# Patient Record
Sex: Female | Born: 1965 | Race: White | Hispanic: No | Marital: Married | Smoking: Never smoker
Health system: Southern US, Community
[De-identification: ages and names within clinical notes are randomized; demographics above are authoritative.]

## PROBLEM LIST (undated history)

## (undated) DIAGNOSIS — C801 Malignant (primary) neoplasm, unspecified: Secondary | ICD-10-CM

## (undated) DIAGNOSIS — T8859XA Other complications of anesthesia, initial encounter: Secondary | ICD-10-CM

## (undated) DIAGNOSIS — T4145XA Adverse effect of unspecified anesthetic, initial encounter: Secondary | ICD-10-CM

## (undated) DIAGNOSIS — R519 Headache, unspecified: Secondary | ICD-10-CM

## (undated) DIAGNOSIS — Z9889 Other specified postprocedural states: Secondary | ICD-10-CM

## (undated) DIAGNOSIS — T7840XA Allergy, unspecified, initial encounter: Secondary | ICD-10-CM

## (undated) DIAGNOSIS — K219 Gastro-esophageal reflux disease without esophagitis: Secondary | ICD-10-CM

## (undated) DIAGNOSIS — N6459 Other signs and symptoms in breast: Secondary | ICD-10-CM

## (undated) DIAGNOSIS — R112 Nausea with vomiting, unspecified: Secondary | ICD-10-CM

## (undated) DIAGNOSIS — R51 Headache: Secondary | ICD-10-CM

## (undated) HISTORY — PX: TONSILLECTOMY: SUR1361

## (undated) HISTORY — DX: Allergy, unspecified, initial encounter: T78.40XA

## (undated) HISTORY — DX: Malignant (primary) neoplasm, unspecified: C80.1

## (undated) HISTORY — PX: CHOLECYSTECTOMY: SHX55

---

## 2018-01-07 DIAGNOSIS — K449 Diaphragmatic hernia without obstruction or gangrene: Secondary | ICD-10-CM | POA: Diagnosis not present

## 2018-01-07 DIAGNOSIS — K219 Gastro-esophageal reflux disease without esophagitis: Secondary | ICD-10-CM | POA: Diagnosis not present

## 2018-01-07 DIAGNOSIS — G43909 Migraine, unspecified, not intractable, without status migrainosus: Secondary | ICD-10-CM | POA: Diagnosis not present

## 2018-01-14 DIAGNOSIS — Z01419 Encounter for gynecological examination (general) (routine) without abnormal findings: Secondary | ICD-10-CM | POA: Diagnosis not present

## 2018-01-14 DIAGNOSIS — Z13220 Encounter for screening for lipoid disorders: Secondary | ICD-10-CM | POA: Diagnosis not present

## 2018-01-25 DIAGNOSIS — Z1231 Encounter for screening mammogram for malignant neoplasm of breast: Secondary | ICD-10-CM | POA: Diagnosis not present

## 2018-01-30 DIAGNOSIS — Z1211 Encounter for screening for malignant neoplasm of colon: Secondary | ICD-10-CM | POA: Diagnosis not present

## 2018-02-08 DIAGNOSIS — R922 Inconclusive mammogram: Secondary | ICD-10-CM | POA: Diagnosis not present

## 2018-02-08 DIAGNOSIS — R928 Other abnormal and inconclusive findings on diagnostic imaging of breast: Secondary | ICD-10-CM | POA: Diagnosis not present

## 2018-02-08 DIAGNOSIS — R921 Mammographic calcification found on diagnostic imaging of breast: Secondary | ICD-10-CM | POA: Diagnosis not present

## 2018-02-11 DIAGNOSIS — N6012 Diffuse cystic mastopathy of left breast: Secondary | ICD-10-CM | POA: Diagnosis not present

## 2018-02-11 DIAGNOSIS — R921 Mammographic calcification found on diagnostic imaging of breast: Secondary | ICD-10-CM | POA: Diagnosis not present

## 2018-02-11 DIAGNOSIS — R928 Other abnormal and inconclusive findings on diagnostic imaging of breast: Secondary | ICD-10-CM | POA: Diagnosis not present

## 2018-02-26 ENCOUNTER — Other Ambulatory Visit: Payer: Self-pay | Admitting: Surgery

## 2018-02-26 DIAGNOSIS — N6320 Unspecified lump in the left breast, unspecified quadrant: Secondary | ICD-10-CM | POA: Diagnosis not present

## 2018-02-26 DIAGNOSIS — N6092 Unspecified benign mammary dysplasia of left breast: Secondary | ICD-10-CM

## 2018-02-28 ENCOUNTER — Other Ambulatory Visit: Payer: Self-pay | Admitting: Surgery

## 2018-02-28 DIAGNOSIS — N6092 Unspecified benign mammary dysplasia of left breast: Secondary | ICD-10-CM

## 2018-03-05 NOTE — Pre-Procedure Instructions (Signed)
Karen Chung  03/05/2018      [place], [place] [address] Phone: [phone] Fax: [phone]    Your procedure is scheduled on March 08, 2018.  Report to [facility] Admitting at 05:30 A.M.  Call this number if you have problems the morning of surgery:  [phone]   Remember:  Do not eat food or drink liquids after midnight.  **Please complete your PRE-SURGERY ENSURE that was provided before you leave your house the morning of surgery.  Please, if able, drink it in one setting. DO NOT SIP.**   Take these medicines the morning of surgery with A SIP OF WATER : Nothing  7 days prior to surgery STOP taking any Aspirin (unless otherwise instructed by your surgeon), Aleve, Naproxen, Ibuprofen, Motrin, Advil, Goody's, BC's, all herbal medications, fish oil, and all vitamins.   Do not wear jewelry, make-up or nail polish.  Do not wear lotions, powders, or perfumes, or deodorant.  Do not shave 48 hours prior to surgery.   Do not bring valuables to the hospital.   [facility] is not responsible for any belongings or valuables.  Contacts, dentures or bridgework may not be worn into surgery.  Leave your suitcase in the car.  After surgery it may be brought to your room.  For patients admitted to the hospital, discharge time will be determined by your treatment team.  Patients discharged the day of surgery will not be allowed to drive home.   Special instructions:   [facility]- Preparing For Surgery  Before surgery, you can play an important role. Because skin is not sterile, your skin needs to be as free of germs as possible. You can reduce the number of germs on your skin by washing with CHG (chlorahexidine gluconate) Soap before surgery.  CHG is an antiseptic cleaner which kills germs and bonds with the skin to continue killing germs even after washing.  Please do not use if you have  an allergy to CHG or antibacterial soaps. If your skin becomes reddened/irritated stop using the CHG.  Do not shave (including legs and underarms) for at least 48 hours prior to first CHG shower. It is OK to shave your face.  Please follow these instructions carefully.   1. Shower the NIGHT BEFORE SURGERY and the MORNING OF SURGERY with CHG.   2. If you chose to wash your hair, wash your hair first as usual with your normal shampoo.  3. After you shampoo, rinse your hair and body thoroughly to remove the shampoo.  4. Use CHG as you would any other liquid soap. You can apply CHG directly to the skin and wash gently with a scrungie or a clean washcloth.   5. Apply the CHG Soap to your body ONLY FROM THE NECK DOWN.  Do not use on open wounds or open sores. Avoid contact with your eyes, ears, mouth and genitals (private parts). Wash Face and genitals (private parts)  with your normal soap.  6. Wash thoroughly, paying special attention to the area where your surgery will be performed.  7. Thoroughly rinse your body with warm water from the neck down.  8. DO NOT shower/wash with your normal soap after using and rinsing off the CHG Soap.  9. Pat yourself dry with a CLEAN TOWEL.  10. Wear CLEAN PAJAMAS to bed the night before surgery, wear comfortable clothes the morning  of surgery  11. Place CLEAN SHEETS on your bed the night of your first shower and DO NOT SLEEP WITH PETS.    Day of Surgery: Do not apply any deodorants/lotions. Please wear clean clothes to the hospital/surgery center.      Please read over the following fact sheets that you were given.

## 2018-03-06 ENCOUNTER — Encounter (HOSPITAL_COMMUNITY): Payer: Self-pay

## 2018-03-06 ENCOUNTER — Encounter (HOSPITAL_COMMUNITY)
Admission: RE | Admit: 2018-03-06 | Discharge: 2018-03-06 | Disposition: A | Discharge status: Home / Self Care | Payer: BLUE CROSS/BLUE SHIELD | Source: Ambulatory Visit | Attending: Surgery | Admitting: Surgery

## 2018-03-06 ENCOUNTER — Other Ambulatory Visit: Payer: Self-pay

## 2018-03-06 DIAGNOSIS — Z793 Long term (current) use of hormonal contraceptives: Secondary | ICD-10-CM | POA: Diagnosis not present

## 2018-03-06 DIAGNOSIS — F329 Major depressive disorder, single episode, unspecified: Secondary | ICD-10-CM | POA: Diagnosis not present

## 2018-03-06 DIAGNOSIS — Z791 Long term (current) use of non-steroidal anti-inflammatories (NSAID): Secondary | ICD-10-CM | POA: Diagnosis not present

## 2018-03-06 DIAGNOSIS — Z85820 Personal history of malignant melanoma of skin: Secondary | ICD-10-CM | POA: Diagnosis not present

## 2018-03-06 DIAGNOSIS — N6092 Unspecified benign mammary dysplasia of left breast: Secondary | ICD-10-CM | POA: Diagnosis not present

## 2018-03-06 DIAGNOSIS — Z79899 Other long term (current) drug therapy: Secondary | ICD-10-CM | POA: Diagnosis not present

## 2018-03-06 DIAGNOSIS — N6022 Fibroadenosis of left breast: Secondary | ICD-10-CM | POA: Diagnosis not present

## 2018-03-06 DIAGNOSIS — K219 Gastro-esophageal reflux disease without esophagitis: Secondary | ICD-10-CM | POA: Diagnosis not present

## 2018-03-06 DIAGNOSIS — Z6832 Body mass index (BMI) 32.0-32.9, adult: Secondary | ICD-10-CM | POA: Diagnosis not present

## 2018-03-06 HISTORY — DX: Other specified postprocedural states: R11.2

## 2018-03-06 HISTORY — DX: Adverse effect of unspecified anesthetic, initial encounter: T41.45XA

## 2018-03-06 HISTORY — DX: Other complications of anesthesia, initial encounter: T88.59XA

## 2018-03-06 HISTORY — DX: Other specified postprocedural states: Z98.890

## 2018-03-06 HISTORY — DX: Other signs and symptoms in breast: N64.59

## 2018-03-06 HISTORY — DX: Gastro-esophageal reflux disease without esophagitis: K21.9

## 2018-03-06 HISTORY — DX: Headache: R51

## 2018-03-06 HISTORY — DX: Headache, unspecified: R51.9

## 2018-03-06 LAB — BASIC METABOLIC PANEL
Anion gap: 8 (ref 5–15)
BUN: 7 mg/dL (ref 6–20)
CHLORIDE: 107 mmol/L (ref 101–111)
CO2: 23 mmol/L (ref 22–32)
Calcium: 8.7 mg/dL — ABNORMAL LOW (ref 8.9–10.3)
Creatinine, Ser: 0.68 mg/dL (ref 0.44–1.00)
GFR calc Af Amer: >60 mL/min (ref >60)
GFR calc non Af Amer: >60 mL/min (ref >60)
Glucose, Bld: 94 mg/dL (ref 65–99)
POTASSIUM: 3.4 mmol/L — AB (ref 3.5–5.1)
SODIUM: 138 mmol/L (ref 135–145)

## 2018-03-06 LAB — CBC
HCT: 40.4 % (ref 36.0–46.0)
Hemoglobin: 13.8 g/dL (ref 12.0–15.0)
MCH: 30.4 pg (ref 26.0–34.0)
MCHC: 34.2 g/dL (ref 30.0–36.0)
MCV: 89 fL (ref 78.0–100.0)
Platelets: 276 10*3/uL (ref 150–400)
RBC: 4.54 MIL/uL (ref 3.87–5.11)
RDW: 12.9 % (ref 11.5–15.5)
WBC: 9.2 10*3/uL (ref 4.0–10.5)

## 2018-03-06 NOTE — Progress Notes (Signed)
PCP - Dr. Standley Brooking- [facility]  Cardiologist - Denies  Chest x-ray - Denies  EKG - Denies  Stress Test - Denies  ECHO - Denies  Cardiac Cath - Denies  Sleep Study - Denies CPAP - None  LABS- 03/06/18: CBC, BMP   Anesthesia- No  Pt denies having chest pain, sob, or fever at this time. All instructions explained to the pt, with a verbal understanding of the material. Pt agrees to go over the instructions while at home for a better understanding. The opportunity to ask questions was provided.

## 2018-03-07 ENCOUNTER — Ambulatory Visit
Admission: RE | Admit: 2018-03-07 | Discharge: 2018-03-07 | Disposition: A | Discharge status: Home / Self Care | Payer: BLUE CROSS/BLUE SHIELD | Source: Ambulatory Visit | Attending: Surgery | Admitting: Surgery

## 2018-03-07 DIAGNOSIS — N6092 Unspecified benign mammary dysplasia of left breast: Secondary | ICD-10-CM

## 2018-03-07 DIAGNOSIS — N6082 Other benign mammary dysplasias of left breast: Secondary | ICD-10-CM | POA: Diagnosis not present

## 2018-03-07 NOTE — H&P (Signed)
Karen Chung Documented: 02/26/2018 11:38 AM Location: [facility] Patient #: [id number] DOB: January 26, 1966 Married / Language: Undefined / Race: Refused to Report/Unreported Female   History of Present Illness (Freman Lapage A. Ninfa Linden MD; 02/26/2018 12:01 PM) The patient is a 52 year old female who presents with a breast mass.  Additional reasons for visit:  Breast problems are described as the following: This patient is referred by Dr. Jeryl Columbia from [facility] after the recent diagnosis of abnormality of the left breast. Unfortunately, I will send all the information except the mammogram and pathology. 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 We are trying to obtain those records. She has no family history of breast cancer although her mother is adopted so she does not know her extended family history. She has had no previous problems with her breasts. She denies nipple discharge. She reports that she handled the biopsy well. She is otherwise healthy without complaints.   Past Surgical History Malachi Bonds, CMA; 02/26/2018 12:02 PM) Gallbladder Surgery - Laparoscopic  Tonsillectomy   Diagnostic Studies History Malachi Bonds, CMA; 02/26/2018 12:02 PM) Colonoscopy  never Mammogram  within last year Pap Smear  1-5 years ago  Allergies Levonne Spiller, CMA; 02/26/2018 11:40 AM) Codeine/Codeine Derivatives  Azithromycin *CHEMICALS*  Allergies Reconciled   Medication History (Danielle Gerrigner, CMA; 02/26/2018 11:40 AM) Amitriptyline HCl (10MG  Tablet, Oral) Active. Norethindrone (0.35MG  Tablet, Oral) Active. Omeprazole (40MG  Capsule DR, Oral) Active. Medications Reconciled  Social History Malachi Bonds, CMA; 02/26/2018 12:02 PM) Caffeine use  Tea. No alcohol use  No drug use  Tobacco use  Never smoker.  Family History Malachi Bonds, CMA; 02/26/2018 12:02  PM) Cancer  Father.  Pregnancy / Birth History Malachi Bonds, CMA; 02/26/2018 12:02 PM) Age at menarche  89 years. Contraceptive History  Oral contraceptives. Gravida  3 Length (months) of breastfeeding  3-6 Maternal age  40-25 Para  3 Regular periods   Other Problems Malachi Bonds, CMA; 02/26/2018 12:02 PM) Cholelithiasis  Gastroesophageal Reflux Disease  Melanoma  Migraine Headache   Vitals (Danielle Gerrigner CMA; 02/26/2018 11:40 AM) 02/26/2018 11:40 AM Weight: 213.38 lb Height: 68in Body Surface Area: 2.1 m Body Mass Index: 32.44 kg/m  Temp.: 99.29F(Oral)  Pulse: 111 (Regular)  BP: 132/98 (Sitting, Right Arm, Large)       Physical Exam (Yamili Lichtenwalner A. Ninfa Linden MD; 02/26/2018 12:01 PM) General Mental Status-Alert. General Appearance-Consistent with stated age. Hydration-Well hydrated. Voice-Normal.  Head and Neck Head-normocephalic, atraumatic with no lesions or palpable masses. Trachea-midline. Thyroid Gland Characteristics - normal size and consistency.  Eye Eyeball - Bilateral-Extraocular movements intact. Sclera/Conjunctiva - Bilateral-No scleral icterus.  Chest and Lung Exam Chest and lung exam reveals -quiet, even and easy respiratory effort with no use of accessory muscles and on auscultation, normal breath sounds, no adventitious sounds and normal vocal resonance. Inspection Chest Wall - Normal. Back - normal.  Breast Breast - Left-Symmetric, Non Tender, No Biopsy scars, no Dimpling, No Inflammation, No Lumpectomy scars, No Mastectomy scars, No Peau d' Orange. Breast - Right-Symmetric, Non Tender, No Biopsy scars, no Dimpling, No Inflammation, No Lumpectomy scars, No Mastectomy scars, No Peau d' Orange. Breast Lump-No Palpable Breast Mass.  Cardiovascular Cardiovascular examination reveals -normal heart sounds, regular rate and rhythm with no murmurs and normal pedal pulses  bilaterally.  Abdomen Inspection Inspection of the abdomen reveals - No Hernias. Skin - Scar - no surgical scars. Palpation/Percussion Palpation and Percussion  of the abdomen reveal - Soft, Non Tender, No Rebound tenderness, No Rigidity (guarding) and No hepatosplenomegaly. Auscultation Auscultation of the abdomen reveals - Bowel sounds normal.  Neurologic - Did not examine.  Musculoskeletal - Did not examine.  Lymphatic Head & Neck  General Head & Neck Lymphatics: Bilateral - Description - Normal. Axillary  General Axillary Region: Bilateral - Description - Normal. Tenderness - Non Tender. Femoral & Inguinal - Did not examine.    Assessment & Plan (Brean Carberry A. Ninfa Linden MD; 02/26/2018 12:02 PM) LEFT BREAST MASS (N63.20) Impression: Again, I do not have her pathology results for mammograms currently. We are trying to obtain those. I have one record says atypical ductal hyperplasia of the left breast. Again, the patient says she was told she had breast cancer. I had a long discussion with her and her husband regarding all the options depending what the pathology shows. We discussed a radioactive CT-guided left breast lumpectomy. We also discussed sentinel node biopsy of the pathology showed malignancy. We also discussed referral to the cancer center if necessary. I discussed all the risk of surgery in detail. I will call them back as soon as I know her final pathology and mammogram results.  Addendum Note(Landrey Mahurin A. Ninfa Linden MD; 02/26/2018 2:58 PM) I had now received her pathology results and the mammograms from [facility]. In the left breast, there is a 10 mm area of calcifications in the upper outer quadrant of the breast. The stereotactic biopsy showed both atypical ductal hyperplasia and atypical lobular hyperplasia. No malignancy was identified.  I have discussed the pathology with her by phone. Again, a left breast radioactive seed guided lumpectomy is recommended to remove this  area completely for complete histologic evaluation drawn malignancy. I again discussed the surgical procedure with her and she agrees to proceed

## 2018-03-07 NOTE — Anesthesia Preprocedure Evaluation (Addendum)
Anesthesia Evaluation  Patient identified by MRN, date of birth, ID band Patient awake    Reviewed: Allergy & Precautions, NPO status , Patient's Chart, lab work & pertinent test results  History of Anesthesia Complications (+) PONV  Airway Mallampati: III  TM Distance: >3 FB Neck ROM: Full    Dental  (+) Dental Advisory Given   Pulmonary neg pulmonary ROS,    breath sounds clear to auscultation       Cardiovascular negative cardio ROS   Rhythm:Regular Rate:Normal     Neuro/Psych  Headaches, Depression    GI/Hepatic Neg liver ROS, GERD  Medicated and Controlled,  Endo/Other  Morbid obesity  Renal/GU negative Renal ROS     Musculoskeletal   Abdominal   Peds  Hematology negative hematology ROS (+)   Anesthesia Other Findings   Reproductive/Obstetrics                            Anesthesia Physical Anesthesia Plan  ASA: II  Anesthesia Plan: General   Post-op Pain Management:    Induction: Intravenous  PONV Risk Score and Plan: 4 or greater and Ondansetron, Dexamethasone and Scopolamine patch - Pre-op  Airway Management Planned: Oral ETT  Additional Equipment:   Intra-op Plan:   Post-operative Plan: Extubation in OR  Informed Consent: I have reviewed the patients History and Physical, chart, labs and discussed the procedure including the risks, benefits and alternatives for the proposed anesthesia with the patient or authorized representative who has indicated his/her understanding and acceptance.   Dental advisory given  Plan Discussed with: CRNA and Surgeon  Anesthesia Plan Comments: (Plan routine monitors, GETA)        Anesthesia Quick Evaluation

## 2018-03-08 ENCOUNTER — Encounter (HOSPITAL_COMMUNITY)
Admission: RE | Disposition: A | Discharge status: Home / Self Care | Payer: Self-pay | Source: Ambulatory Visit | Attending: Surgery

## 2018-03-08 ENCOUNTER — Ambulatory Visit (HOSPITAL_COMMUNITY): Payer: BLUE CROSS/BLUE SHIELD | Admitting: Anesthesiology

## 2018-03-08 ENCOUNTER — Encounter (HOSPITAL_COMMUNITY): Payer: Self-pay | Admitting: Certified Registered Nurse Anesthetist

## 2018-03-08 ENCOUNTER — Ambulatory Visit (HOSPITAL_COMMUNITY)
Admission: RE | Admit: 2018-03-08 | Discharge: 2018-03-08 | Disposition: A | Discharge status: Home / Self Care | Payer: BLUE CROSS/BLUE SHIELD | Source: Ambulatory Visit | Attending: Surgery | Admitting: Surgery

## 2018-03-08 ENCOUNTER — Ambulatory Visit
Admission: RE | Admit: 2018-03-08 | Discharge: 2018-03-08 | Disposition: A | Discharge status: Home / Self Care | Payer: BLUE CROSS/BLUE SHIELD | Source: Ambulatory Visit | Attending: Surgery | Admitting: Surgery

## 2018-03-08 DIAGNOSIS — Z6832 Body mass index (BMI) 32.0-32.9, adult: Secondary | ICD-10-CM | POA: Insufficient documentation

## 2018-03-08 DIAGNOSIS — Z793 Long term (current) use of hormonal contraceptives: Secondary | ICD-10-CM | POA: Insufficient documentation

## 2018-03-08 DIAGNOSIS — Z79899 Other long term (current) drug therapy: Secondary | ICD-10-CM | POA: Diagnosis not present

## 2018-03-08 DIAGNOSIS — Z791 Long term (current) use of non-steroidal anti-inflammatories (NSAID): Secondary | ICD-10-CM | POA: Diagnosis not present

## 2018-03-08 DIAGNOSIS — Z85820 Personal history of malignant melanoma of skin: Secondary | ICD-10-CM | POA: Insufficient documentation

## 2018-03-08 DIAGNOSIS — F329 Major depressive disorder, single episode, unspecified: Secondary | ICD-10-CM | POA: Diagnosis not present

## 2018-03-08 DIAGNOSIS — N6092 Unspecified benign mammary dysplasia of left breast: Secondary | ICD-10-CM

## 2018-03-08 DIAGNOSIS — K219 Gastro-esophageal reflux disease without esophagitis: Secondary | ICD-10-CM | POA: Diagnosis not present

## 2018-03-08 DIAGNOSIS — D4862 Neoplasm of uncertain behavior of left breast: Secondary | ICD-10-CM | POA: Diagnosis not present

## 2018-03-08 DIAGNOSIS — N6022 Fibroadenosis of left breast: Secondary | ICD-10-CM | POA: Diagnosis not present

## 2018-03-08 DIAGNOSIS — N6012 Diffuse cystic mastopathy of left breast: Secondary | ICD-10-CM | POA: Diagnosis not present

## 2018-03-08 DIAGNOSIS — N6082 Other benign mammary dysplasias of left breast: Secondary | ICD-10-CM | POA: Diagnosis not present

## 2018-03-08 HISTORY — PX: BREAST LUMPECTOMY WITH RADIOACTIVE SEED LOCALIZATION: SHX6424

## 2018-03-08 HISTORY — PX: BREAST EXCISIONAL BIOPSY: SUR124

## 2018-03-08 LAB — POCT PREGNANCY, URINE: Preg Test, Ur: NEGATIVE

## 2018-03-08 SURGERY — BREAST LUMPECTOMY WITH RADIOACTIVE SEED LOCALIZATION
Anesthesia: General | Site: Breast | Laterality: Left

## 2018-03-08 MED ORDER — CELECOXIB 200 MG PO CAPS
ORAL_CAPSULE | ORAL | Status: AC
  Administered 2018-03-08: 200 mg via ORAL
  Filled 2018-03-08: qty 1

## 2018-03-08 MED ORDER — PHENYLEPHRINE 40 MCG/ML (10ML) SYRINGE FOR IV PUSH (FOR BLOOD PRESSURE SUPPORT)
PREFILLED_SYRINGE | INTRAVENOUS | Status: AC
  Filled 2018-03-08: qty 10

## 2018-03-08 MED ORDER — EPHEDRINE 5 MG/ML INJ
INTRAVENOUS | Status: AC
  Filled 2018-03-08: qty 10

## 2018-03-08 MED ORDER — OXYCODONE HCL 5 MG PO TABS: 5.0000 mg | ORAL_TABLET | Freq: Four times a day (QID) | ORAL | 0 refills | Status: DC | PRN

## 2018-03-08 MED ORDER — PROMETHAZINE HCL 25 MG/ML IJ SOLN
INTRAMUSCULAR | Status: AC
  Filled 2018-03-08: qty 1

## 2018-03-08 MED ORDER — FENTANYL CITRATE (PF) 100 MCG/2ML IJ SOLN
INTRAMUSCULAR | Status: AC
  Filled 2018-03-08: qty 2

## 2018-03-08 MED ORDER — SUCCINYLCHOLINE CHLORIDE 200 MG/10ML IV SOSY
PREFILLED_SYRINGE | INTRAVENOUS | Status: AC
  Filled 2018-03-08: qty 10

## 2018-03-08 MED ORDER — PROMETHAZINE HCL 25 MG/ML IJ SOLN
6.2500 mg | INTRAMUSCULAR | Status: DC | PRN
  Administered 2018-03-08: 12.5 mg via INTRAVENOUS

## 2018-03-08 MED ORDER — CHLORHEXIDINE GLUCONATE CLOTH 2 % EX PADS: 6.0000 | MEDICATED_PAD | Freq: Once | CUTANEOUS | Status: DC

## 2018-03-08 MED ORDER — BUPIVACAINE-EPINEPHRINE (PF) 0.25% -1:200000 IJ SOLN
INTRAMUSCULAR | Status: AC
  Filled 2018-03-08: qty 30

## 2018-03-08 MED ORDER — FENTANYL CITRATE (PF) 250 MCG/5ML IJ SOLN
INTRAMUSCULAR | Status: DC | PRN
  Administered 2018-03-08: 100 ug via INTRAVENOUS
  Administered 2018-03-08: 50 ug via INTRAVENOUS

## 2018-03-08 MED ORDER — ACETAMINOPHEN 500 MG PO TABS
1000.0000 mg | ORAL_TABLET | ORAL | Status: AC
  Administered 2018-03-08: 1000 mg via ORAL

## 2018-03-08 MED ORDER — CELECOXIB 200 MG PO CAPS
200.0000 mg | ORAL_CAPSULE | ORAL | Status: AC
  Administered 2018-03-08: 200 mg via ORAL

## 2018-03-08 MED ORDER — MIDAZOLAM HCL 2 MG/2ML IJ SOLN
INTRAMUSCULAR | Status: DC | PRN
  Administered 2018-03-08 (×2): 1 mg via INTRAVENOUS

## 2018-03-08 MED ORDER — LIDOCAINE 2% (20 MG/ML) 5 ML SYRINGE
INTRAMUSCULAR | Status: DC | PRN
  Administered 2018-03-08: 40 mg via INTRAVENOUS

## 2018-03-08 MED ORDER — LACTATED RINGERS IV SOLN
INTRAVENOUS | Status: DC | PRN
  Administered 2018-03-08: 07:00:00 via INTRAVENOUS

## 2018-03-08 MED ORDER — PROPOFOL 10 MG/ML IV BOLUS
INTRAVENOUS | Status: AC
  Filled 2018-03-08: qty 20

## 2018-03-08 MED ORDER — CEFAZOLIN SODIUM-DEXTROSE 2-4 GM/100ML-% IV SOLN
2.0000 g | INTRAVENOUS | Status: AC
  Administered 2018-03-08: 2 g via INTRAVENOUS

## 2018-03-08 MED ORDER — MIDAZOLAM HCL 2 MG/2ML IJ SOLN
INTRAMUSCULAR | Status: AC
Start: 2018-03-08 — End: ?
  Filled 2018-03-08: qty 2

## 2018-03-08 MED ORDER — DEXAMETHASONE SODIUM PHOSPHATE 10 MG/ML IJ SOLN
INTRAMUSCULAR | Status: DC | PRN
  Administered 2018-03-08: 10 mg via INTRAVENOUS

## 2018-03-08 MED ORDER — SUCCINYLCHOLINE CHLORIDE 20 MG/ML IJ SOLN
INTRAMUSCULAR | Status: DC | PRN
  Administered 2018-03-08: 100 mg via INTRAVENOUS

## 2018-03-08 MED ORDER — PHENYLEPHRINE 40 MCG/ML (10ML) SYRINGE FOR IV PUSH (FOR BLOOD PRESSURE SUPPORT)
PREFILLED_SYRINGE | INTRAVENOUS | Status: DC | PRN
  Administered 2018-03-08 (×4): 80 ug via INTRAVENOUS

## 2018-03-08 MED ORDER — DEXAMETHASONE SODIUM PHOSPHATE 10 MG/ML IJ SOLN
INTRAMUSCULAR | Status: AC
  Filled 2018-03-08: qty 1

## 2018-03-08 MED ORDER — BUPIVACAINE-EPINEPHRINE 0.25% -1:200000 IJ SOLN
INTRAMUSCULAR | Status: DC | PRN
  Administered 2018-03-08: 20 mL

## 2018-03-08 MED ORDER — MIDAZOLAM HCL 2 MG/2ML IJ SOLN: 0.5000 mg | Freq: Once | INTRAMUSCULAR | Status: DC | PRN

## 2018-03-08 MED ORDER — 0.9 % SODIUM CHLORIDE (POUR BTL) OPTIME
TOPICAL | Status: DC | PRN
  Administered 2018-03-08: 1000 mL

## 2018-03-08 MED ORDER — FENTANYL CITRATE (PF) 250 MCG/5ML IJ SOLN
INTRAMUSCULAR | Status: AC
  Filled 2018-03-08: qty 5

## 2018-03-08 MED ORDER — ONDANSETRON HCL 4 MG/2ML IJ SOLN
INTRAMUSCULAR | Status: AC
  Filled 2018-03-08: qty 2

## 2018-03-08 MED ORDER — LIDOCAINE HCL (CARDIAC) 20 MG/ML IV SOLN
INTRAVENOUS | Status: AC
  Filled 2018-03-08: qty 5

## 2018-03-08 MED ORDER — MEPERIDINE HCL 50 MG/ML IJ SOLN: 6.2500 mg | INTRAMUSCULAR | Status: DC | PRN

## 2018-03-08 MED ORDER — FENTANYL CITRATE (PF) 100 MCG/2ML IJ SOLN
25.0000 ug | INTRAMUSCULAR | Status: DC | PRN
  Administered 2018-03-08: 50 ug via INTRAVENOUS

## 2018-03-08 MED ORDER — GABAPENTIN 300 MG PO CAPS
300.0000 mg | ORAL_CAPSULE | ORAL | Status: AC
  Administered 2018-03-08: 300 mg via ORAL

## 2018-03-08 MED ORDER — ROCURONIUM BROMIDE 10 MG/ML (PF) SYRINGE
PREFILLED_SYRINGE | INTRAVENOUS | Status: AC
  Filled 2018-03-08: qty 5

## 2018-03-08 MED ORDER — ONDANSETRON HCL 4 MG/2ML IJ SOLN
INTRAMUSCULAR | Status: DC | PRN
  Administered 2018-03-08: 4 mg via INTRAVENOUS

## 2018-03-08 MED ORDER — PROPOFOL 10 MG/ML IV BOLUS
INTRAVENOUS | Status: DC | PRN
  Administered 2018-03-08: 150 mg via INTRAVENOUS

## 2018-03-08 MED ORDER — ACETAMINOPHEN 500 MG PO TABS
ORAL_TABLET | ORAL | Status: AC
  Administered 2018-03-08: 1000 mg via ORAL
  Filled 2018-03-08: qty 2

## 2018-03-08 MED ORDER — CEFAZOLIN SODIUM-DEXTROSE 2-4 GM/100ML-% IV SOLN
INTRAVENOUS | Status: AC
  Filled 2018-03-08: qty 100

## 2018-03-08 MED ORDER — SCOPOLAMINE 1 MG/3DAYS TD PT72
MEDICATED_PATCH | TRANSDERMAL | Status: DC | PRN
  Administered 2018-03-08: 1 via TRANSDERMAL

## 2018-03-08 MED ORDER — GABAPENTIN 300 MG PO CAPS
ORAL_CAPSULE | ORAL | Status: AC
  Administered 2018-03-08: 300 mg via ORAL
  Filled 2018-03-08: qty 1

## 2018-03-08 SURGICAL SUPPLY — 39 items
APPLIER CLIP 9.375 MED OPEN (MISCELLANEOUS) ×2
BINDER BREAST LRG (GAUZE/BANDAGES/DRESSINGS) IMPLANT
BINDER BREAST XLRG (GAUZE/BANDAGES/DRESSINGS) IMPLANT
BINDER BREAST XXLRG (GAUZE/BANDAGES/DRESSINGS) ×2 IMPLANT
BLADE SURG 15 STRL LF DISP TIS (BLADE) ×1 IMPLANT
BLADE SURG 15 STRL SS (BLADE) ×1
CANISTER SUCT 3000ML PPV (MISCELLANEOUS) ×2 IMPLANT
CHLORAPREP W/TINT 26ML (MISCELLANEOUS) ×2 IMPLANT
CLIP APPLIE 9.375 MED OPEN (MISCELLANEOUS) ×1 IMPLANT
COVER PROBE W GEL 5X96 (DRAPES) ×2 IMPLANT
COVER SURGICAL LIGHT HANDLE (MISCELLANEOUS) ×2 IMPLANT
DERMABOND ADVANCED (GAUZE/BANDAGES/DRESSINGS) ×1
DERMABOND ADVANCED .7 DNX12 (GAUZE/BANDAGES/DRESSINGS) ×1 IMPLANT
DEVICE DUBIN SPECIMEN MAMMOGRA (MISCELLANEOUS) ×2 IMPLANT
DRAPE CHEST BREAST 15X10 FENES (DRAPES) ×2 IMPLANT
DRAPE UTILITY XL STRL (DRAPES) ×2 IMPLANT
ELECT CAUTERY BLADE 6.4 (BLADE) ×2 IMPLANT
ELECT REM PT RETURN 9FT ADLT (ELECTROSURGICAL) ×2
ELECTRODE REM PT RTRN 9FT ADLT (ELECTROSURGICAL) ×1 IMPLANT
GLOVE SURG SIGNA 7.5 PF LTX (GLOVE) ×2 IMPLANT
GOWN STRL REUS W/ TWL LRG LVL3 (GOWN DISPOSABLE) ×1 IMPLANT
GOWN STRL REUS W/ TWL XL LVL3 (GOWN DISPOSABLE) ×1 IMPLANT
GOWN STRL REUS W/TWL LRG LVL3 (GOWN DISPOSABLE) ×1
GOWN STRL REUS W/TWL XL LVL3 (GOWN DISPOSABLE) ×1
KIT BASIN OR (CUSTOM PROCEDURE TRAY) ×2 IMPLANT
KIT MARKER MARGIN INK (KITS) ×2 IMPLANT
NEEDLE HYPO 25GX1X1/2 BEV (NEEDLE) ×2 IMPLANT
NS IRRIG 1000ML POUR BTL (IV SOLUTION) IMPLANT
PACK SURGICAL SETUP 50X90 (CUSTOM PROCEDURE TRAY) ×2 IMPLANT
PENCIL BUTTON HOLSTER BLD 10FT (ELECTRODE) ×2 IMPLANT
SPONGE LAP 18X18 X RAY DECT (DISPOSABLE) ×2 IMPLANT
SUT MNCRL AB 4-0 PS2 18 (SUTURE) ×2 IMPLANT
SUT VIC AB 3-0 SH 18 (SUTURE) ×2 IMPLANT
SYR BULB 3OZ (MISCELLANEOUS) ×2 IMPLANT
SYR CONTROL 10ML LL (SYRINGE) ×2 IMPLANT
TOWEL OR 17X24 6PK STRL BLUE (TOWEL DISPOSABLE) ×2 IMPLANT
TOWEL OR 17X26 10 PK STRL BLUE (TOWEL DISPOSABLE) ×2 IMPLANT
TUBE CONNECTING 12X1/4 (SUCTIONS) ×2 IMPLANT
YANKAUER SUCT BULB TIP NO VENT (SUCTIONS) ×2 IMPLANT

## 2018-03-08 NOTE — Interval H&P Note (Signed)
History and Physical Interval Note:no change in H and P  03/08/2018 7:11 AM  Karen Chung  has presented today for surgery, with the diagnosis of left breast atypical cells  The various methods of treatment have been discussed with the patient and family. After consideration of risks, benefits and other options for treatment, the patient has consented to  Procedure(s): LEFT BREAST LUMPECTOMY WITH RADIOACTIVE SEED LOCALIZATION ERAS PATHWAY (Left) as a surgical intervention .  The patient's history has been reviewed, patient examined, no change in status, stable for surgery.  I have reviewed the patient's chart and labs.  Questions were answered to the patient's satisfaction.     [name]

## 2018-03-08 NOTE — Op Note (Signed)
LEFT BREAST LUMPECTOMY WITH RADIOACTIVE SEED LOCALIZATION ERAS PATHWAY  Procedure Note  KHRISTEN CHEYNEY 03/08/2018   Pre-op Diagnosis: left breast atypical cells     Post-op Diagnosis: same  Procedure(s): LEFT BREAST LUMPECTOMY WITH RADIOACTIVE SEED LOCALIZATION ERAS PATHWAY  Surgeon(s): Coralie Keens, MD  Anesthesia: General  Staff:  Circulator: Nicholos Johns, RN Scrub Person: Caswell Corwin T  Estimated Blood Loss: Minimal               Specimens: sent to path  Indications: This is a 52 year old female who underwent a stereotactic biopsy of the suspicious area in the left breast.  The final pathology showed atypical cells including atypical lobular hyperplasia.  The decision was made to proceed with a radioactive seed guided left breast lumpectomy  Procedure: The patient was brought to the operating room and identified as the correct patient.  She was placed supine on the operating table and general anesthesia was induced.  Her left breast was then prepped and draped in the usual sterile fashion.  Identified the radioactive seed that was deep in the left outer quadrant of the breast.  I decided to make an incision laterally in the breast after anesthetizing with Marcaine.  I then tunneled deep into the breast tissue and more medially with the aid of the neoprobe until I reached the radioactive seed.  I then performed a lumpectomy staying widely around the seed with the electrocautery.  Once the lumpectomy specimen was removed, I marked all margins with paint.  The specimen was x-rayed confirming that the radioactive seed and previous marker in the specimen.  It was then sent to pathology for evaluation.  I achieved hemostasis with the cautery.  I placed a single surgical clip into the biopsy cavity.  I then closed the subcutaneous tissue with interrupted 3-0 Vicryl sutures and closed the skin with a running 4-0 Monocryl suture.  Dermabond was then applied.  The patient tolerated  the procedure well.  All the counts were correct at the end of procedure.  The patient was then extubated in the operating room and taken in a stable condition to the recovery room.          Ikran Patman A   Date: 03/08/2018  Time: 8:17 AM

## 2018-03-08 NOTE — Anesthesia Procedure Notes (Signed)
Procedure Name: Intubation Date/Time: 03/08/2018 7:35 AM Performed by: Julieta Bellini, CRNA Pre-anesthesia Checklist: Emergency Drugs available, Suction available, Patient being monitored and Patient identified Patient Re-evaluated:Patient Re-evaluated prior to induction Oxygen Delivery Method: Circle system utilized Preoxygenation: Pre-oxygenation with 100% oxygen Induction Type: IV induction and Cricoid Pressure applied Ventilation: Mask ventilation without difficulty Laryngoscope Size: Mac and 3 Grade View: Grade II Tube type: Oral Tube size: 7.0 mm Number of attempts: 1 Airway Equipment and Method: Stylet Placement Confirmation: ETT inserted through vocal cords under direct vision,  positive ETCO2 and breath sounds checked- equal and bilateral Secured at: 22 cm Tube secured with: Tape Dental Injury: Teeth and Oropharynx as per pre-operative assessment

## 2018-03-08 NOTE — Transfer of Care (Signed)
Immediate Anesthesia Transfer of Care Note  Patient: Karen Chung  Procedure(s) Performed: LEFT BREAST LUMPECTOMY WITH RADIOACTIVE SEED LOCALIZATION ERAS PATHWAY (Left Breast)  Patient Location: PACU  Anesthesia Type:General  Level of Consciousness: awake, alert , oriented and patient cooperative  Airway & Oxygen Therapy: Patient Spontanous Breathing  Post-op Assessment: Report given to RN, Post -op Vital signs reviewed and stable and Patient moving all extremities X 4  Post vital signs: Reviewed and stable  Last Vitals:  Vitals Value Taken Time  BP 142/93 03/08/2018  8:21 AM  Temp 36.3 C 03/08/2018  8:20 AM  Pulse 108 03/08/2018  8:24 AM  Resp 17 03/08/2018  8:24 AM  SpO2 93 % 03/08/2018  8:24 AM  Vitals shown include unvalidated device data.  Last Pain:  Vitals:   03/08/18 0820  TempSrc:   PainSc: Asleep         Complications: No apparent anesthesia complications

## 2018-03-08 NOTE — Anesthesia Postprocedure Evaluation (Signed)
Anesthesia Post Note  Patient: Karen Chung  Procedure(s) Performed: LEFT BREAST LUMPECTOMY WITH RADIOACTIVE SEED LOCALIZATION ERAS PATHWAY (Left Breast)     Patient location during evaluation: PACU Anesthesia Type: General Level of consciousness: awake and alert, oriented and patient cooperative Pain management: pain level controlled Vital Signs Assessment: post-procedure vital signs reviewed and stable Respiratory status: spontaneous breathing, nonlabored ventilation and respiratory function stable Cardiovascular status: blood pressure returned to baseline and stable : nausea improved. Anesthetic complications: no    Last Vitals:  Vitals Value Taken Time  BP    Temp    Pulse    Resp    SpO2      Last Pain:  Vitals:   03/08/18 0925  TempSrc:   PainSc: 0-No pain                 Zain Bingman,E. Manvir Prabhu

## 2018-03-08 NOTE — Discharge Instructions (Signed)
[facility] Office Phone Number [phone]  BREAST BIOPSY/ PARTIAL MASTECTOMY: POST OP INSTRUCTIONS  Always review your discharge instruction sheet given to you by the facility where your surgery was performed.  IF YOU HAVE DISABILITY OR FAMILY LEAVE FORMS, YOU MUST BRING THEM TO THE OFFICE FOR PROCESSING.  DO NOT GIVE THEM TO YOUR DOCTOR.  1. A prescription for pain medication may be given to you upon discharge.  Take your pain medication as prescribed, if needed.  If narcotic pain medicine is not needed, then you may take acetaminophen (Tylenol) or ibuprofen (Advil) as needed. 2. Take your usually prescribed medications unless otherwise directed 3. If you need a refill on your pain medication, please contact your pharmacy.  They will contact our office to request authorization.  Prescriptions will not be filled after 5pm or on week-ends. 4. You should eat very light the first 24 hours after surgery, such as soup, crackers, pudding, etc.  Resume your normal diet the day after surgery. 5. Most patients will experience some swelling and bruising in the breast.  Ice packs and a good support bra will help.  Swelling and bruising can take several days to resolve.  6. It is common to experience some constipation if taking pain medication after surgery.  Increasing fluid intake and taking a stool softener will usually help or prevent this problem from occurring.  A mild laxative (Milk of Magnesia or Miralax) should be taken according to package directions if there are no bowel movements after 48 hours. 7. Unless discharge instructions indicate otherwise, you may remove your bandages 24-48 hours after surgery, and you may shower at that time.  You may have steri-strips (small skin tapes) in place directly over the incision.  These strips should be left on the skin for 7-10 days.  If your surgeon used skin glue on the incision, you may shower in 24 hours.  The glue will flake off over the  next 2-3 weeks.  Any sutures or staples will be removed at the office during your follow-up visit. 8. ACTIVITIES:  You may resume regular daily activities (gradually increasing) beginning the next day.  Wearing a good support bra or sports bra minimizes pain and swelling.  You may have sexual intercourse when it is comfortable. a. You may drive when you no longer are taking prescription pain medication, you can comfortably wear a seatbelt, and you can safely maneuver your car and apply brakes. b. RETURN TO WORK:  ______________________________________________________________________________________ 9. You should see your doctor in the office for a follow-up appointment approximately two weeks after your surgery.  Your doctors nurse will typically make your follow-up appointment when she calls you with your pathology report.  Expect your pathology report 2-3 business days after your surgery.  You may call to check if you do not hear from [place] after three days. 10. OTHER INSTRUCTIONS: ___ok to shower starting tomorrow 11. Ice pack, tylenol, and ibuprofen also for pain 12. ____________________________________________________________________________________________ _____________________________________________________________________________________________________________________________________ _____________________________________________________________________________________________________________________________________ _____________________________________________________________________________________________________________________________________  WHEN TO CALL YOUR DOCTOR: 1. Fever over 101.0 2. Nausea and/or vomiting. 3. Extreme swelling or bruising. 4. Continued bleeding from incision. 5. Increased pain, redness, or drainage from the incision.  The clinic staff is available to answer your questions during regular business hours.  Please dont hesitate to call and ask to speak to one of  the nurses for clinical concerns.  If you have a medical emergency, go to the nearest emergency room or call 911.  A surgeon from [facility]  is always on call at the hospital.  For further questions, please visit [email or web address]

## 2018-03-09 ENCOUNTER — Encounter (HOSPITAL_COMMUNITY): Payer: Self-pay | Admitting: Surgery

## 2018-04-09 ENCOUNTER — Encounter: Payer: Self-pay | Admitting: Hematology and Oncology

## 2018-04-09 ENCOUNTER — Telehealth: Payer: Self-pay | Admitting: Hematology and Oncology

## 2018-04-09 NOTE — Telephone Encounter (Signed)
Appt has been scheduled for the pt to see Dr. Lindi Adie on 5/8 at 345pm. Pt aware to arrive 30 minutes early. Letter mailed to the pt.

## 2018-04-22 ENCOUNTER — Telehealth: Payer: Self-pay | Admitting: Hematology and Oncology

## 2018-04-22 NOTE — Telephone Encounter (Signed)
Pt cld to reschedule appt to 6/18 at 1pm.

## 2018-04-24 ENCOUNTER — Ambulatory Visit: Payer: BLUE CROSS/BLUE SHIELD | Admitting: Hematology and Oncology

## 2018-06-04 ENCOUNTER — Inpatient Hospital Stay: Payer: BLUE CROSS/BLUE SHIELD | Attending: Hematology and Oncology | Admitting: Hematology and Oncology

## 2018-06-04 ENCOUNTER — Telehealth: Payer: Self-pay | Admitting: Hematology and Oncology

## 2018-06-04 DIAGNOSIS — Z806 Family history of leukemia: Secondary | ICD-10-CM | POA: Diagnosis not present

## 2018-06-04 DIAGNOSIS — N6092 Unspecified benign mammary dysplasia of left breast: Secondary | ICD-10-CM | POA: Insufficient documentation

## 2018-06-04 MED ORDER — RALOXIFENE HCL 60 MG PO TABS: 60.0000 mg | ORAL_TABLET | Freq: Every day | ORAL | 3 refills | Status: DC

## 2018-06-04 NOTE — Progress Notes (Signed)
[facility] CONSULT NOTE  Patient Care Team: Serita Grammes, MD as PCP - General (Family Medicine)  CHIEF COMPLAINTS/PURPOSE OF CONSULTATION:  Left breast atypical lobular hyperplasia  HISTORY OF PRESENTING ILLNESS:  Karen Chung 52 y.o. female is here because of recent diagnosis of left breast atypical lobular hyperplasia.  Patient had a routine screening mammogram that detected an abnormality in the left breast.  She underwent lumpectomy which revealed atypical lobular hyperplasia.  She was sent to [place] for discussion regarding risk reduction measures.  She is accompanied by her husband.  She has 3 children.  She used to live in [place] and then moved up to [place] because her husband is a Theme park manager at about discharge.  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  She is recovered very well from the prior surgery.  I reviewed her records extensively and collaborated the history with the patient.  MEDICAL HISTORY:  Past Medical History:  Diagnosis Date  . Abnormal cells in breast    Atypical in Left  . Complication of anesthesia   . GERD (gastroesophageal reflux disease)   . Headache   . PONV (postoperative nausea and vomiting)     SURGICAL HISTORY: Past Surgical History:  Procedure Laterality Date  . BREAST LUMPECTOMY WITH RADIOACTIVE SEED LOCALIZATION Left 03/08/2018   Procedure: LEFT BREAST LUMPECTOMY WITH RADIOACTIVE SEED LOCALIZATION ERAS PATHWAY;  Surgeon: Coralie Keens, MD;  Location: [facility];  Service: General;  Laterality: Left;  . CHOLECYSTECTOMY     10/2010  . TONSILLECTOMY      SOCIAL HISTORY: Married denies any tobacco alcohol or recreational drug use FAMILY HISTORY: Her mother was adopted so there is no family history that they are aware of.  Father had leukemia at a young age. Family History  Problem Relation Age of Onset  . High blood pressure Mother   . Leukemia Father     ALLERGIES:  is allergic to codeine  and erythromycin.  MEDICATIONS:  Current Outpatient Medications  Medication Sig Dispense Refill  . amitriptyline (ELAVIL) 10 MG tablet Take 20 mg by mouth at bedtime.    Marland Kitchen ibuprofen (ADVIL,MOTRIN) 200 MG tablet Take 200 mg by mouth every 6 (six) hours as needed.    . norethindrone (MICRONOR,CAMILA,ERRIN) 0.35 MG tablet Take 1 tablet by mouth at bedtime.    Marland Kitchen omeprazole (PRILOSEC) 40 MG capsule Take 40 mg by mouth every evening.    Marland Kitchen oxyCODONE (OXY IR/ROXICODONE) 5 MG immediate release tablet Take 1-2 tablets (5-10 mg total) by mouth every 6 (six) hours as needed for moderate pain, severe pain or breakthrough pain. 25 tablet 0  . Phenylephrine-Acetaminophen (TYLENOL SINUS+HEADACHE PO) Take 2 tablets by mouth 2 (two) times daily as needed (for pain/headaches.).    Marland Kitchen raloxifene (EVISTA) 60 MG tablet Take 1 tablet (60 mg total) by mouth daily. 90 tablet 3   No current facility-administered medications for this visit.     REVIEW OF SYSTEMS:   Constitutional: Denies fevers, chills or abnormal night sweats Eyes: Denies blurriness of vision, double vision or watery eyes Ears, nose, mouth, throat, and face: Denies mucositis or sore throat Respiratory: Denies cough, dyspnea or wheezes Cardiovascular: Denies palpitation, chest discomfort or lower extremity swelling Gastrointestinal:  Denies nausea, heartburn or change in bowel habits Skin: Denies abnormal skin rashes Lymphatics: Denies new lymphadenopathy or easy bruising Neurological:Denies numbness, tingling or new weaknesses Behavioral/Psych: Mood is stable, no new changes  Breast: Recent left lumpectomy All other systems  were reviewed with the patient and are negative.  PHYSICAL EXAMINATION: ECOG PERFORMANCE STATUS: 1 - Symptomatic but completely ambulatory  Vitals:   06/04/18 1300  BP: 136/84  Pulse: 95  Resp: 20  Temp: 98.8 F (37.1 C)  SpO2: 98%   Filed Weights   06/04/18 1300  Weight: 203 lb 4.8 oz (92.2 kg)     GENERAL:alert, no distress and comfortable SKIN: skin color, texture, turgor are normal, no rashes or significant lesions EYES: normal, conjunctiva are pink and non-injected, sclera clear OROPHARYNX:no exudate, no erythema and lips, buccal mucosa, and tongue normal  NECK: supple, thyroid normal size, non-tender, without nodularity LYMPH:  no palpable lymphadenopathy in the cervical, axillary or inguinal LUNGS: clear to auscultation and percussion with normal breathing effort HEART: regular rate & rhythm and no murmurs and no lower extremity edema ABDOMEN:abdomen soft, non-tender and normal bowel sounds Musculoskeletal:no cyanosis of digits and no clubbing  PSYCH: alert & oriented x 3 with fluent speech NEURO: no focal motor/sensory deficits  LABORATORY DATA:  I have reviewed the data as listed Lab Results  Component Value Date   WBC 9.2 03/06/2018   HGB 13.8 03/06/2018   HCT 40.4 03/06/2018   MCV 89.0 03/06/2018   PLT 276 03/06/2018   Lab Results  Component Value Date   NA 138 03/06/2018   K 3.4 (L) 03/06/2018   CL 107 03/06/2018   CO2 23 03/06/2018    RADIOGRAPHIC STUDIES: I have personally reviewed the radiological reports and agreed with the findings in the report.  ASSESSMENT AND PLAN:  Atypical lobular hyperplasia (ALH) of left breast Left lumpectomy 03/11/2018: Atypical lobular hyperplasia  Atypical lobular hyperplasia: This is characterized by abnormal cells that are filling part of the lobule.  It appears to increase the risk of breast cancer by 3.75 fold.  There is risk of both ipsilateral and contralateral breast cancers.  The cumulative incidence of breast cancer is approximately 1 %/year.  Based upon NCI breast cancer risk assessment tool: Risk of breast cancer is 20% lifetime risk and 2.5% 5-year risk  Risk reduction strategies: I recommended appropriate lifestyle and dietary changes that include exercise, eating less red meat and increasing fruits and  vegetables. Risk reduction with tamoxifen or raloxifene would reduce the risk by half.  Raloxifene counseling: We discussed the risks and benefits of tamoxifen. These include but not limited to insomnia, hot flashes, mood changes, vaginal dryness, and weight gain. Although rare, serious side effects including endometrial cancer, risk of blood clots were also discussed. We strongly believe that the benefits far outweigh the risks. Patient understands these risks and consented to starting treatment. Planned treatment duration is 5 years. Patient is willing to take raloxifene.  I sent a prescription for 60 mg daily to her pharmacy.  Breast cancer surveillance: Annual mammograms, consider breast MRI because a lifetime risk of breast cancer risk of more than 20% along with breast exams.  Return to clinic in 3 months for evaluation of side effects.  If she tolerates it well then we can see her once a year.  I instructed her to stop birth control pills.  All questions were answered. The patient knows to call the clinic with any problems, questions or concerns.    Harriette Ohara, MD 06/04/18

## 2018-06-04 NOTE — Telephone Encounter (Signed)
Gave avs and calendar ° °

## 2018-06-04 NOTE — Assessment & Plan Note (Signed)
Left lumpectomy 03/11/2018: Atypical lobular hyperplasia  Atypical lobular hyperplasia: This is characterized by abnormal cells that are filling part of the lobule.  It appears to increase the risk of breast cancer by 3.75 fold.  There is risk of both ipsilateral and contralateral breast cancers.  The cumulative incidence of breast cancer is approximately 1 %/year.  Based upon NCI breast cancer risk assessment tool: Risk of breast cancer is  Risk reduction strategies: I recommended appropriate lifestyle and dietary changes that include exercise, eating less red meat and increasing fruits and vegetables. Risk reduction with tamoxifen or raloxifene would reduce the risk by half.  Tamoxifen counseling: We discussed the risks and benefits of tamoxifen. These include but not limited to insomnia, hot flashes, mood changes, vaginal dryness, and weight gain. Although rare, serious side effects including endometrial cancer, risk of blood clots were also discussed. We strongly believe that the benefits far outweigh the risks. Patient understands these risks and consented to starting treatment. Planned treatment duration is 5 years.  Breast cancer surveillance: Annual mammograms, consider breast MRI because a lifetime risk of breast cancer risk of more than 20% along with breast exams.

## 2018-07-04 DIAGNOSIS — Z6831 Body mass index (BMI) 31.0-31.9, adult: Secondary | ICD-10-CM | POA: Diagnosis not present

## 2018-07-04 DIAGNOSIS — N6320 Unspecified lump in the left breast, unspecified quadrant: Secondary | ICD-10-CM | POA: Diagnosis not present

## 2018-07-04 DIAGNOSIS — N951 Menopausal and female climacteric states: Secondary | ICD-10-CM | POA: Diagnosis not present

## 2018-07-16 ENCOUNTER — Telehealth: Payer: Self-pay

## 2018-07-16 NOTE — Telephone Encounter (Signed)
Returned pt's call regarding stopping BCP and whether Dr Lindi Adie thinks a Mirena would be a good idea-- No answer, left pt a VM with our contact info, instructed pt to call back so nurse can get further info to ask Dr Lindi Adie for patient.

## 2018-07-17 ENCOUNTER — Telehealth: Payer: Self-pay

## 2018-07-17 NOTE — Telephone Encounter (Signed)
Second attempt to contact pt regarding information on IUD.  No answer, no voicemail.

## 2018-07-17 NOTE — Telephone Encounter (Signed)
Pt called to report that she has stopped taking her birth control pills as instructed.  She wants to know if she can get an IUD.  Per Dr Lindi Adie pt may get a non hormonal IUD.  This RN returned pt's call to number provided but no answer and no voicemail.  Will call again  later today.

## 2018-08-27 ENCOUNTER — Telehealth: Payer: Self-pay | Admitting: Hematology and Oncology

## 2018-08-27 NOTE — Telephone Encounter (Signed)
[facility] - moved 9/18 appointment to 9/19. Spoke with patient.

## 2018-09-04 ENCOUNTER — Ambulatory Visit: Payer: BLUE CROSS/BLUE SHIELD | Admitting: Hematology and Oncology

## 2018-09-04 NOTE — Assessment & Plan Note (Signed)
Left lumpectomy 03/11/2018: Atypical lobular hyperplasia Risk Reduction: Raloxifene started June 2019 Toxicities:  Breast cancer surveillance: Annual mammograms, consider breast MRI because a lifetime risk of breast cancer risk of more than 20% along with breast exams.  RTC in 6 months

## 2018-09-05 ENCOUNTER — Telehealth: Payer: Self-pay | Admitting: Hematology and Oncology

## 2018-09-05 ENCOUNTER — Inpatient Hospital Stay: Payer: BLUE CROSS/BLUE SHIELD | Attending: Hematology and Oncology | Admitting: Hematology and Oncology

## 2018-09-05 DIAGNOSIS — N951 Menopausal and female climacteric states: Secondary | ICD-10-CM | POA: Diagnosis not present

## 2018-09-05 DIAGNOSIS — N6092 Unspecified benign mammary dysplasia of left breast: Secondary | ICD-10-CM | POA: Insufficient documentation

## 2018-09-05 DIAGNOSIS — R252 Cramp and spasm: Secondary | ICD-10-CM

## 2018-09-05 NOTE — Progress Notes (Signed)
Patient Care Team: Serita Grammes, MD as PCP - General (Family Medicine)  DIAGNOSIS:  Encounter Diagnosis  Name Primary?  . Atypical lobular hyperplasia [facility]) of left breast    Current treatment: Raloxifene started June 2019 CHIEF COMPLIANT: Follow-up on raloxifene  INTERVAL HISTORY: Karen Chung is a 52 year old with above-mentioned history of atypical lobular hyperplasia who is on risk reduction therapy with raloxifene.  She appears to be tolerating it extremely well.  She has very occasional hot flashes and occasional muscle cramps.  She recently celebrated her 52th birthday.  REVIEW OF SYSTEMS:   Constitutional: Denies fevers, chills or abnormal weight loss Eyes: Denies blurriness of vision Ears, nose, mouth, throat, and face: Denies mucositis or sore throat Respiratory: Denies cough, dyspnea or wheezes Cardiovascular: Denies palpitation, chest discomfort Gastrointestinal:  Denies nausea, heartburn or change in bowel habits Skin: Denies abnormal skin rashes Lymphatics: Denies new lymphadenopathy or easy bruising Neurological:Denies numbness, tingling or new weaknesses Behavioral/Psych: Mood is stable, no new changes  Extremities: No lower extremity edema Breast:  denies any pain or lumps or nodules in either breasts All other systems were reviewed with the patient and are negative.  I have reviewed the past medical history, past surgical history, social history and family history with the patient and they are unchanged from previous note.  ALLERGIES:  is allergic to codeine and erythromycin.  MEDICATIONS:  Current Outpatient Medications  Medication Sig Dispense Refill  . amitriptyline (ELAVIL) 10 MG tablet Take 20 mg by mouth at bedtime.    Marland Kitchen ibuprofen (ADVIL,MOTRIN) 200 MG tablet Take 200 mg by mouth every 6 (six) hours as needed.    . norethindrone (MICRONOR,CAMILA,ERRIN) 0.35 MG tablet Take 1 tablet by mouth at bedtime.    Marland Kitchen omeprazole (PRILOSEC) 40 MG capsule  Take 40 mg by mouth every evening.    Marland Kitchen oxyCODONE (OXY IR/ROXICODONE) 5 MG immediate release tablet Take 1-2 tablets (5-10 mg total) by mouth every 6 (six) hours as needed for moderate pain, severe pain or breakthrough pain. 25 tablet 0  . Phenylephrine-Acetaminophen (TYLENOL SINUS+HEADACHE PO) Take 2 tablets by mouth 2 (two) times daily as needed (for pain/headaches.).    Marland Kitchen raloxifene (EVISTA) 60 MG tablet Take 1 tablet (60 mg total) by mouth daily. 90 tablet 3   No current facility-administered medications for this visit.     PHYSICAL EXAMINATION: ECOG PERFORMANCE STATUS: 0 - Asymptomatic  Vitals:   09/05/18 1022  BP: 133/64  Pulse: 78  Resp: 18  Temp: 98.1 F (36.7 C)  SpO2: 100%   Filed Weights   09/05/18 1022  Weight: 206 lb 12.8 oz (93.8 kg)    GENERAL:alert, no distress and comfortable SKIN: skin color, texture, turgor are normal, no rashes or significant lesions EYES: normal, Conjunctiva are pink and non-injected, sclera clear OROPHARYNX:no exudate, no erythema and lips, buccal mucosa, and tongue normal  NECK: supple, thyroid normal size, non-tender, without nodularity LYMPH:  no palpable lymphadenopathy in the cervical, axillary or inguinal LUNGS: clear to auscultation and percussion with normal breathing effort HEART: regular rate & rhythm and no murmurs and no lower extremity edema ABDOMEN:abdomen soft, non-tender and normal bowel sounds MUSCULOSKELETAL:no cyanosis of digits and no clubbing  NEURO: alert & oriented x 3 with fluent speech, no focal motor/sensory deficits EXTREMITIES: No lower extremity edema   LABORATORY DATA:  I have reviewed the data as listed CMP Latest Ref Rng & Units 03/06/2018  Glucose 65 - 99 mg/dL 94  BUN 6 - 20 mg/dL 7  Creatinine 0.44 - 1.00 mg/dL 0.68  Sodium 135 - 145 mmol/L 138  Potassium 3.5 - 5.1 mmol/L 3.4(L)  Chloride 101 - 111 mmol/L 107  CO2 22 - 32 mmol/L 23  Calcium 8.9 - 10.3 mg/dL 8.7(L)    Lab Results  Component  Value Date   WBC 9.2 03/06/2018   HGB 13.8 03/06/2018   HCT 40.4 03/06/2018   MCV 89.0 03/06/2018   PLT 276 03/06/2018    ASSESSMENT & PLAN:  Atypical lobular hyperplasia (ALH) of left breast Left lumpectomy 03/11/2018: Atypical lobular hyperplasia Risk Reduction: Raloxifene started June 2019 Toxicities: Very occasional hot flashes and muscle cramps  Breast cancer surveillance: Annual mammograms, consider breast MRI because a lifetime risk of breast cancer risk of more than 20% along with breast exams. I will set her up for mammograms to be done at the breast center to be done at the end of January Breast MRIs to be done in July 2020. RTC in 6 months    No orders of the defined types were placed in this encounter.  The patient has a good understanding of the overall plan. she agrees with it. she will call with any problems that may develop before the next visit here.   Harriette Ohara, MD 09/05/18

## 2018-09-05 NOTE — Telephone Encounter (Signed)
Gave patient avs and calendar.   °

## 2018-10-07 DIAGNOSIS — G43909 Migraine, unspecified, not intractable, without status migrainosus: Secondary | ICD-10-CM | POA: Diagnosis not present

## 2018-10-07 DIAGNOSIS — Z6831 Body mass index (BMI) 31.0-31.9, adult: Secondary | ICD-10-CM | POA: Diagnosis not present

## 2018-10-07 DIAGNOSIS — C50912 Malignant neoplasm of unspecified site of left female breast: Secondary | ICD-10-CM | POA: Diagnosis not present

## 2018-10-07 DIAGNOSIS — N951 Menopausal and female climacteric states: Secondary | ICD-10-CM | POA: Diagnosis not present

## 2018-10-14 DIAGNOSIS — Z6832 Body mass index (BMI) 32.0-32.9, adult: Secondary | ICD-10-CM | POA: Diagnosis not present

## 2018-10-14 DIAGNOSIS — J069 Acute upper respiratory infection, unspecified: Secondary | ICD-10-CM | POA: Diagnosis not present

## 2018-10-15 DIAGNOSIS — Z30430 Encounter for insertion of intrauterine contraceptive device: Secondary | ICD-10-CM | POA: Diagnosis not present

## 2018-10-24 DIAGNOSIS — Z30431 Encounter for routine checking of intrauterine contraceptive device: Secondary | ICD-10-CM | POA: Diagnosis not present

## 2018-10-24 DIAGNOSIS — Z6832 Body mass index (BMI) 32.0-32.9, adult: Secondary | ICD-10-CM | POA: Diagnosis not present

## 2018-11-25 DIAGNOSIS — G43909 Migraine, unspecified, not intractable, without status migrainosus: Secondary | ICD-10-CM | POA: Diagnosis not present

## 2018-11-25 DIAGNOSIS — Z30431 Encounter for routine checking of intrauterine contraceptive device: Secondary | ICD-10-CM | POA: Diagnosis not present

## 2018-11-25 DIAGNOSIS — Z6832 Body mass index (BMI) 32.0-32.9, adult: Secondary | ICD-10-CM | POA: Diagnosis not present

## 2019-02-17 ENCOUNTER — Ambulatory Visit
Admission: RE | Admit: 2019-02-17 | Discharge: 2019-02-17 | Disposition: A | Discharge status: Home / Self Care | Payer: BLUE CROSS/BLUE SHIELD | Source: Ambulatory Visit | Attending: Hematology and Oncology | Admitting: Hematology and Oncology

## 2019-02-17 ENCOUNTER — Other Ambulatory Visit: Payer: Self-pay | Admitting: Hematology and Oncology

## 2019-02-17 DIAGNOSIS — Z1231 Encounter for screening mammogram for malignant neoplasm of breast: Secondary | ICD-10-CM

## 2019-02-17 DIAGNOSIS — N6092 Unspecified benign mammary dysplasia of left breast: Secondary | ICD-10-CM

## 2019-03-03 ENCOUNTER — Other Ambulatory Visit: Payer: Self-pay | Admitting: Hematology and Oncology

## 2019-03-12 ENCOUNTER — Ambulatory Visit: Payer: BLUE CROSS/BLUE SHIELD | Admitting: Hematology and Oncology

## 2019-03-12 ENCOUNTER — Telehealth: Payer: Self-pay | Admitting: Hematology and Oncology

## 2019-03-12 ENCOUNTER — Inpatient Hospital Stay: Payer: BLUE CROSS/BLUE SHIELD | Attending: Hematology and Oncology | Admitting: Hematology and Oncology

## 2019-03-12 DIAGNOSIS — Z79899 Other long term (current) drug therapy: Secondary | ICD-10-CM

## 2019-03-12 DIAGNOSIS — N6092 Unspecified benign mammary dysplasia of left breast: Secondary | ICD-10-CM | POA: Diagnosis not present

## 2019-03-12 DIAGNOSIS — Z1231 Encounter for screening mammogram for malignant neoplasm of breast: Secondary | ICD-10-CM

## 2019-03-12 MED ORDER — SUMATRIPTAN SUCCINATE 50 MG PO TABS: 50.0000 mg | ORAL_TABLET | ORAL | 0 refills | Status: AC | PRN

## 2019-03-12 NOTE — Progress Notes (Signed)
HEMATOLOGY-ONCOLOGY TELEPHONE VISIT PROGRESS NOTE  I connected with Karen Chung on 03/12/2019 at 11:45 AM EDT by telephone and verified that I am speaking with the correct person using two identifiers.  I discussed the limitations, risks, security and privacy concerns of performing an evaluation and management service by telephone and the availability of in person appointments.  I also discussed with the patient that there may be a patient responsible charge related to this service. The patient expressed understanding and agreed to proceed.   History of Present Illness: Karen Chung is a 53 year old with above-mentioned history of atypical lobular hyperplasia who is on risk reduction therapy with raloxifene. Her most recent mammogram on 02/17/19 showed no evidence of malignancy bilaterally.   Observations/Objective: Patient is not experiencing any adverse effects to the treatment.    Assessment Plan:  Atypical lobular hyperplasia [facility]) of left breast Left lumpectomy 03/11/2018: Atypical lobular hyperplasia Risk Reduction: Raloxifene started June 2019 Toxicities: Very occasional hot flashes and muscle cramps  Breast cancer surveillance:Annual mammograms, consider breast MRI because a lifetime risk of breast cancer risk of more than 20% along with breast exams. I will set her up for mammograms to be done at the breast center to be done at the end of January Breast MRIs to be done in Sept 2020. We will plan to do MRIs every other year. RTC in 1 year    I discussed the assessment and treatment plan with the patient. The patient was provided an opportunity to ask questions and all were answered. The patient agreed with the plan and demonstrated an understanding of the instructions. The patient was advised to call back or seek an in-person evaluation if the symptoms worsen or if the condition fails to improve as anticipated.   I provided 12 minutes of non-face-to-face time during this  encounter. Rulon Eisenmenger, MD

## 2019-03-12 NOTE — Telephone Encounter (Signed)
Virtual visit 

## 2019-03-12 NOTE — Assessment & Plan Note (Signed)
Left lumpectomy 03/11/2018: Atypical lobular hyperplasia Risk Reduction: Raloxifene started June 2019 Toxicities: Very occasional hot flashes and muscle cramps  Breast cancer surveillance:Annual mammograms, consider breast MRI because a lifetime risk of breast cancer risk of more than 20% along with breast exams. I will set her up for mammograms to be done at the breast center to be done at the end of January Breast MRIs to be done in Sept 2020. RTC in 1 year

## 2019-03-13 DIAGNOSIS — Z124 Encounter for screening for malignant neoplasm of cervix: Secondary | ICD-10-CM | POA: Diagnosis not present

## 2019-03-13 DIAGNOSIS — Z1331 Encounter for screening for depression: Secondary | ICD-10-CM | POA: Diagnosis not present

## 2019-03-13 DIAGNOSIS — Z6833 Body mass index (BMI) 33.0-33.9, adult: Secondary | ICD-10-CM | POA: Diagnosis not present

## 2019-03-13 DIAGNOSIS — Z01419 Encounter for gynecological examination (general) (routine) without abnormal findings: Secondary | ICD-10-CM | POA: Diagnosis not present

## 2019-03-17 ENCOUNTER — Telehealth: Payer: Self-pay | Admitting: Hematology and Oncology

## 2019-03-17 NOTE — Telephone Encounter (Signed)
Scheduled appt per 3/26 sch message - sent reminder letter in the mail. F/u in 1 year.

## 2019-03-18 DIAGNOSIS — Z131 Encounter for screening for diabetes mellitus: Secondary | ICD-10-CM | POA: Diagnosis not present

## 2019-03-18 DIAGNOSIS — Z13220 Encounter for screening for lipoid disorders: Secondary | ICD-10-CM | POA: Diagnosis not present

## 2019-03-18 DIAGNOSIS — Z79899 Other long term (current) drug therapy: Secondary | ICD-10-CM | POA: Diagnosis not present

## 2019-03-18 DIAGNOSIS — R635 Abnormal weight gain: Secondary | ICD-10-CM | POA: Diagnosis not present

## 2019-04-09 ENCOUNTER — Telehealth: Payer: Self-pay | Admitting: Hematology and Oncology

## 2019-04-09 NOTE — Telephone Encounter (Signed)
Cancelled 4/29 appointment per 4/22 schedule message patient to follow up 2021 (already on scheduled). Follow up for 4/29 was old appointment from September 2019.

## 2019-04-14 DIAGNOSIS — Z6832 Body mass index (BMI) 32.0-32.9, adult: Secondary | ICD-10-CM | POA: Diagnosis not present

## 2019-04-14 DIAGNOSIS — R635 Abnormal weight gain: Secondary | ICD-10-CM | POA: Diagnosis not present

## 2019-04-14 DIAGNOSIS — G43909 Migraine, unspecified, not intractable, without status migrainosus: Secondary | ICD-10-CM | POA: Diagnosis not present

## 2019-04-14 DIAGNOSIS — J309 Allergic rhinitis, unspecified: Secondary | ICD-10-CM | POA: Diagnosis not present

## 2019-04-16 ENCOUNTER — Ambulatory Visit: Payer: BLUE CROSS/BLUE SHIELD | Admitting: Hematology and Oncology

## 2019-04-28 DIAGNOSIS — Z6832 Body mass index (BMI) 32.0-32.9, adult: Secondary | ICD-10-CM | POA: Diagnosis not present

## 2019-04-28 DIAGNOSIS — R197 Diarrhea, unspecified: Secondary | ICD-10-CM | POA: Diagnosis not present

## 2019-04-28 DIAGNOSIS — R05 Cough: Secondary | ICD-10-CM | POA: Diagnosis not present

## 2019-04-28 DIAGNOSIS — R6889 Other general symptoms and signs: Secondary | ICD-10-CM | POA: Diagnosis not present

## 2019-05-26 DIAGNOSIS — Z6832 Body mass index (BMI) 32.0-32.9, adult: Secondary | ICD-10-CM | POA: Diagnosis not present

## 2019-05-26 DIAGNOSIS — G43909 Migraine, unspecified, not intractable, without status migrainosus: Secondary | ICD-10-CM | POA: Diagnosis not present

## 2019-05-26 DIAGNOSIS — J309 Allergic rhinitis, unspecified: Secondary | ICD-10-CM | POA: Diagnosis not present

## 2019-09-17 ENCOUNTER — Other Ambulatory Visit: Payer: BLUE CROSS/BLUE SHIELD

## 2019-12-19 HISTORY — PX: BREAST BIOPSY: SHX20

## 2019-12-22 ENCOUNTER — Other Ambulatory Visit: Payer: Self-pay | Admitting: Hematology and Oncology

## 2019-12-22 DIAGNOSIS — Z1231 Encounter for screening mammogram for malignant neoplasm of breast: Secondary | ICD-10-CM

## 2020-02-19 ENCOUNTER — Ambulatory Visit
Admission: RE | Admit: 2020-02-19 | Discharge: 2020-02-19 | Disposition: A | Discharge status: Home / Self Care | Payer: BC Managed Care – PPO | Source: Ambulatory Visit | Attending: Hematology and Oncology | Admitting: Hematology and Oncology

## 2020-02-19 ENCOUNTER — Other Ambulatory Visit: Payer: Self-pay

## 2020-02-19 DIAGNOSIS — Z1231 Encounter for screening mammogram for malignant neoplasm of breast: Secondary | ICD-10-CM | POA: Diagnosis not present

## 2020-02-26 ENCOUNTER — Other Ambulatory Visit: Payer: Self-pay

## 2020-02-26 ENCOUNTER — Ambulatory Visit
Admission: RE | Admit: 2020-02-26 | Discharge: 2020-02-26 | Disposition: A | Discharge status: Home / Self Care | Payer: BC Managed Care – PPO | Source: Ambulatory Visit | Attending: Hematology and Oncology | Admitting: Hematology and Oncology

## 2020-02-26 ENCOUNTER — Other Ambulatory Visit: Payer: Self-pay | Admitting: Hematology and Oncology

## 2020-02-26 ENCOUNTER — Telehealth: Payer: Self-pay | Admitting: *Deleted

## 2020-02-26 ENCOUNTER — Other Ambulatory Visit: Payer: Self-pay | Admitting: *Deleted

## 2020-02-26 DIAGNOSIS — N6092 Unspecified benign mammary dysplasia of left breast: Secondary | ICD-10-CM

## 2020-02-26 DIAGNOSIS — N6324 Unspecified lump in the left breast, lower inner quadrant: Secondary | ICD-10-CM | POA: Diagnosis not present

## 2020-02-26 DIAGNOSIS — N6323 Unspecified lump in the left breast, lower outer quadrant: Secondary | ICD-10-CM | POA: Diagnosis not present

## 2020-02-26 DIAGNOSIS — Z1231 Encounter for screening mammogram for malignant neoplasm of breast: Secondary | ICD-10-CM

## 2020-02-26 MED ORDER — GADOBUTROL 1 MMOL/ML IV SOLN
9.0000 mL | Freq: Once | INTRAVENOUS | Status: AC | PRN
  Administered 2020-02-26: 11:00:00 9 mL via INTRAVENOUS

## 2020-02-26 NOTE — Progress Notes (Signed)
Per MD, order placed for left breast MRI biopsy to be preformed at [place] to assess 0.6 cm enhancing mass in the lower central posterior left breast.  [facility] notified to contact pt regarding apt date and time.

## 2020-02-26 NOTE — Telephone Encounter (Signed)
Received call from [facility] stating they could not get a hold of pt to notify her of upcoming MRI biopsy apt.  RN attempt x1 to contact pt and pt husband regarding apt.  No answer, LVM at both numbers to return call to the office.

## 2020-03-03 ENCOUNTER — Other Ambulatory Visit (HOSPITAL_COMMUNITY): Payer: Self-pay | Admitting: Diagnostic Radiology

## 2020-03-03 ENCOUNTER — Other Ambulatory Visit: Payer: Self-pay

## 2020-03-03 ENCOUNTER — Ambulatory Visit
Admission: RE | Admit: 2020-03-03 | Discharge: 2020-03-03 | Disposition: A | Discharge status: Home / Self Care | Payer: BC Managed Care – PPO | Source: Ambulatory Visit | Attending: Hematology and Oncology | Admitting: Hematology and Oncology

## 2020-03-03 DIAGNOSIS — N6323 Unspecified lump in the left breast, lower outer quadrant: Secondary | ICD-10-CM | POA: Diagnosis not present

## 2020-03-03 DIAGNOSIS — N6012 Diffuse cystic mastopathy of left breast: Secondary | ICD-10-CM | POA: Diagnosis not present

## 2020-03-03 DIAGNOSIS — N6092 Unspecified benign mammary dysplasia of left breast: Secondary | ICD-10-CM

## 2020-03-03 MED ORDER — GADOBUTROL 1 MMOL/ML IV SOLN
9.0000 mL | Freq: Once | INTRAVENOUS | Status: AC | PRN
  Administered 2020-03-03: 9 mL via INTRAVENOUS

## 2020-03-12 ENCOUNTER — Inpatient Hospital Stay: Payer: BC Managed Care – PPO | Admitting: Hematology and Oncology

## 2020-03-12 NOTE — Assessment & Plan Note (Deleted)
Left lumpectomy 03/11/2018: Atypical lobular hyperplasia Risk Reduction: Raloxifene started June 2019 Toxicities:Very occasional hot flashes and muscle cramps  Breast cancer surveillance:Annual mammograms, consider breast MRI because a lifetime risk of breast cancer risk of more than 20% along with breast exams.  02/20/2020: Bilateral mammograms: No evidence of malignancy.  Breast density category C 02/26/2020: Breast MRI: 0.6 cm enhancing mass lower central left breast, biopsy 03/03/2020: Fibrocystic change no malignancy identified.  Return to clinic in 1 year for follow-up.

## 2020-03-16 DIAGNOSIS — Z6831 Body mass index (BMI) 31.0-31.9, adult: Secondary | ICD-10-CM | POA: Diagnosis not present

## 2020-03-16 DIAGNOSIS — Z01419 Encounter for gynecological examination (general) (routine) without abnormal findings: Secondary | ICD-10-CM | POA: Diagnosis not present

## 2020-03-16 DIAGNOSIS — G43909 Migraine, unspecified, not intractable, without status migrainosus: Secondary | ICD-10-CM | POA: Diagnosis not present

## 2020-03-16 DIAGNOSIS — Z1331 Encounter for screening for depression: Secondary | ICD-10-CM | POA: Diagnosis not present

## 2020-03-16 DIAGNOSIS — Z13820 Encounter for screening for osteoporosis: Secondary | ICD-10-CM | POA: Diagnosis not present

## 2020-04-21 NOTE — Progress Notes (Signed)
Patient Care Team: Serita Grammes, MD as PCP - General (Family Medicine)  DIAGNOSIS:    ICD-10-CM   1. Atypical lobular hyperplasia (ALH) of left breast  N60.92     CHIEF COMPLIANT: Follow-up of high risk for breast cancer on raloxifene  INTERVAL HISTORY: Karen Chung is a 54 y.o. with above-mentioned history of atypical lobular hyperplasia who is on risk reduction therapy with raloxifene. Mammogram on 02/19/20 showed no evidence of malignancy bilaterally. Breast MRI on 02/26/20 showed a 0.6cm mass in the left breast. Biopsy on 03/03/20 showed fibrocystic changes and no evidence of malignancy. She presents to the clinic today for follow-up.  She is tolerating raloxifene extremely well without any problems or concerns.  She tells me that the migraines are slightly better.  ALLERGIES:  is allergic to codeine and erythromycin.  MEDICATIONS:  Current Outpatient Medications  Medication Sig Dispense Refill  . amitriptyline (ELAVIL) 10 MG tablet Take 20 mg by mouth at bedtime.    Marland Kitchen ibuprofen (ADVIL,MOTRIN) 200 MG tablet Take 200 mg by mouth every 6 (six) hours as needed.    Marland Kitchen omeprazole (PRILOSEC) 40 MG capsule Take 40 mg by mouth every evening.    Marland Kitchen Phenylephrine-Acetaminophen (TYLENOL SINUS+HEADACHE PO) Take 2 tablets by mouth 2 (two) times daily as needed (for pain/headaches.).    Marland Kitchen raloxifene (EVISTA) 60 MG tablet TAKE ONE TABLET BY MOUTH ONCE DAILY 90 tablet 3  . SUMAtriptan (IMITREX) 50 MG tablet Take 1 tablet (50 mg total) by mouth every 2 (two) hours as needed for migraine. May repeat in 2 hours if headache persists or recurs. 10 tablet 0   No current facility-administered medications for this visit.    PHYSICAL EXAMINATION: ECOG PERFORMANCE STATUS: 1 - Symptomatic but completely ambulatory  There were no vitals filed for this visit. There were no vitals filed for this visit.  BREAST: No palpable masses or nodules in either right or left breasts. No palpable axillary  supraclavicular or infraclavicular adenopathy no breast tenderness or nipple discharge. (exam performed in the presence of a chaperone)  LABORATORY DATA:  I have reviewed the data as listed CMP Latest Ref Rng & Units 03/06/2018  Glucose 65 - 99 mg/dL 94  BUN 6 - 20 mg/dL 7  Creatinine 0.44 - 1.00 mg/dL 0.68  Sodium 135 - 145 mmol/L 138  Potassium 3.5 - 5.1 mmol/L 3.4(L)  Chloride 101 - 111 mmol/L 107  CO2 22 - 32 mmol/L 23  Calcium 8.9 - 10.3 mg/dL 8.7(L)    Lab Results  Component Value Date   WBC 9.2 03/06/2018   HGB 13.8 03/06/2018   HCT 40.4 03/06/2018   MCV 89.0 03/06/2018   PLT 276 03/06/2018    ASSESSMENT & PLAN:  Atypical lobular hyperplasia (ALH) of left breast Left lumpectomy 03/11/2018: Atypical lobular hyperplasia Risk Reduction: Raloxifene started June 2019 Toxicities:Very occasional hot flashes and muscle cramps  Breast cancer surveillance: 1.  Mammogram 02/20/2020: No evidence of malignancy breast density category C 2.  Breast MRI 02/26/2020: 0.6 cm enhancing mass left breast, MRI guided biopsy: Fibrocystic change Radiology recommended another MRI in 6 months. I will set her up for another MRI in August 2021.  After that she can have it once a year. Patient lives in [place].  Bone density: She apparently had a bone density test and her primary care physician discussed with her about bisphosphonate therapy.  I do not have that report but I talked to her about raloxifene preventing bone loss.  She is  planning to discuss this further and see if she can watch and monitor without starting any bisphosphonate therapy at this time.  We will continue surveillance and separate the mammogram and breast MRI by 6 months in the future.    No orders of the defined types were placed in this encounter.  The patient has a good understanding of the overall plan. she agrees with it. she will call with any problems that may develop before the next visit here.  Total time spent:  20 mins including face to face time and time spent for planning, charting and coordination of care  Karen Lose, MD 04/22/2020  I, Cloyde Reams Dorshimer, am acting as scribe for Dr. Nicholas Chung.  I have reviewed the above documentation for accuracy and completeness, and I agree with the above.

## 2020-04-22 ENCOUNTER — Other Ambulatory Visit: Payer: Self-pay

## 2020-04-22 ENCOUNTER — Inpatient Hospital Stay: Payer: BC Managed Care – PPO | Attending: Hematology and Oncology | Admitting: Hematology and Oncology

## 2020-04-22 DIAGNOSIS — N6092 Unspecified benign mammary dysplasia of left breast: Secondary | ICD-10-CM | POA: Diagnosis not present

## 2020-04-22 MED ORDER — TOPIRAMATE 50 MG PO TABS: 50.0000 mg | ORAL_TABLET | Freq: Two times a day (BID) | ORAL | Status: AC

## 2020-04-22 MED ORDER — BUPROPION HCL ER (XL) 150 MG PO TB24: 150.0000 mg | ORAL_TABLET | Freq: Every day | ORAL | Status: AC

## 2020-04-22 MED ORDER — RALOXIFENE HCL 60 MG PO TABS: 60.0000 mg | ORAL_TABLET | Freq: Every day | ORAL | 3 refills | Status: DC

## 2020-04-22 NOTE — Assessment & Plan Note (Signed)
Left lumpectomy 03/11/2018: Atypical lobular hyperplasia Risk Reduction: Raloxifene started June 2019 Toxicities:Very occasional hot flashes and muscle cramps  Breast cancer surveillance: 1.  Mammogram 02/20/2020: No evidence of malignancy breast density category C 2.  Breast MRI 02/26/2020: 0.6 cm enhancing mass left breast, MRI guided biopsy: Fibrocystic change  We will continue surveillance and separate the mammogram and breast MRI by 6 months in the future.

## 2020-04-23 ENCOUNTER — Telehealth: Payer: Self-pay | Admitting: Hematology and Oncology

## 2020-04-23 NOTE — Telephone Encounter (Signed)
Scheduled per los, patient has been called and notified. 

## 2020-07-23 ENCOUNTER — Telehealth: Payer: Self-pay | Admitting: *Deleted

## 2020-07-23 NOTE — Telephone Encounter (Signed)
Ok to wait VG

## 2020-07-23 NOTE — Telephone Encounter (Signed)
Karen Chung received a call from [name] to schedule repeat MRI. She is wanting to know if it is OK wait until next year to have the MRI. Would have routine mammogram in March '22 and then MRI in August '22. States there was some discussion with Dr Lindi Adie about if MRI was truly necessary at this point- biopsy was negative.  States husband has been having health issues and she is still paying for the last MRI.   Will whatever Dr Lindi Adie feels is best.

## 2020-07-23 NOTE — Telephone Encounter (Signed)
Notified of message below

## 2020-09-20 DIAGNOSIS — Z6830 Body mass index (BMI) 30.0-30.9, adult: Secondary | ICD-10-CM | POA: Diagnosis not present

## 2020-09-20 DIAGNOSIS — M25562 Pain in left knee: Secondary | ICD-10-CM | POA: Diagnosis not present

## 2020-11-25 DIAGNOSIS — J329 Chronic sinusitis, unspecified: Secondary | ICD-10-CM | POA: Diagnosis not present

## 2020-11-25 DIAGNOSIS — J40 Bronchitis, not specified as acute or chronic: Secondary | ICD-10-CM | POA: Diagnosis not present

## 2020-11-25 DIAGNOSIS — H6592 Unspecified nonsuppurative otitis media, left ear: Secondary | ICD-10-CM | POA: Diagnosis not present

## 2020-11-25 DIAGNOSIS — R0981 Nasal congestion: Secondary | ICD-10-CM | POA: Diagnosis not present

## 2020-12-10 DIAGNOSIS — Z20828 Contact with and (suspected) exposure to other viral communicable diseases: Secondary | ICD-10-CM | POA: Diagnosis not present

## 2020-12-10 DIAGNOSIS — U071 COVID-19: Secondary | ICD-10-CM | POA: Diagnosis not present

## 2021-02-28 ENCOUNTER — Other Ambulatory Visit: Payer: Self-pay | Admitting: Hematology and Oncology

## 2021-03-10 ENCOUNTER — Other Ambulatory Visit: Payer: Self-pay | Admitting: Hematology and Oncology

## 2021-03-10 DIAGNOSIS — Z1231 Encounter for screening mammogram for malignant neoplasm of breast: Secondary | ICD-10-CM

## 2021-03-15 ENCOUNTER — Ambulatory Visit
Admission: RE | Admit: 2021-03-15 | Discharge: 2021-03-15 | Disposition: A | Discharge status: Home / Self Care | Payer: BC Managed Care – PPO | Source: Ambulatory Visit | Attending: Hematology and Oncology | Admitting: Hematology and Oncology

## 2021-03-15 ENCOUNTER — Other Ambulatory Visit: Payer: Self-pay

## 2021-03-15 DIAGNOSIS — Z1231 Encounter for screening mammogram for malignant neoplasm of breast: Secondary | ICD-10-CM | POA: Diagnosis not present

## 2021-03-21 DIAGNOSIS — Z1331 Encounter for screening for depression: Secondary | ICD-10-CM | POA: Diagnosis not present

## 2021-03-21 DIAGNOSIS — Z1211 Encounter for screening for malignant neoplasm of colon: Secondary | ICD-10-CM | POA: Diagnosis not present

## 2021-03-21 DIAGNOSIS — Z01419 Encounter for gynecological examination (general) (routine) without abnormal findings: Secondary | ICD-10-CM | POA: Diagnosis not present

## 2021-03-21 DIAGNOSIS — Z131 Encounter for screening for diabetes mellitus: Secondary | ICD-10-CM | POA: Diagnosis not present

## 2021-03-21 DIAGNOSIS — Z13220 Encounter for screening for lipoid disorders: Secondary | ICD-10-CM | POA: Diagnosis not present

## 2021-03-21 DIAGNOSIS — L819 Disorder of pigmentation, unspecified: Secondary | ICD-10-CM | POA: Diagnosis not present

## 2021-04-04 ENCOUNTER — Encounter: Payer: Self-pay | Admitting: Gastroenterology

## 2021-04-20 NOTE — Progress Notes (Signed)
Patient Care Team: Serita Grammes, MD as PCP - General (Family Medicine)  DIAGNOSIS:    ICD-10-CM   1. 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 with above-mentioned history of atypical lobular hyperplasia who is on risk reduction therapy with raloxifene. Mammogram on 03/15/21 showed no evidence of malignancy bilaterally. She presents to the clinic today for follow-up.  She was previously treated in [place] and moved to [place] area.  She is doing quite well without any problems or concerns.  Does not have any hot flashes or arthralgias or myalgias.  ALLERGIES:  is allergic to codeine and erythromycin.  MEDICATIONS:  Current Outpatient Medications  Medication Sig Dispense Refill  . cetirizine (ZYRTEC ALLERGY) 10 MG tablet Take 1 tablet (10 mg total) by mouth daily.    . pantoprazole (PROTONIX) 20 MG tablet Take 1 tablet (20 mg total) by mouth daily.    Marland Kitchen buPROPion (WELLBUTRIN XL) 150 MG 24 hr tablet Take 1 tablet (150 mg total) by mouth daily.    Marland Kitchen ibuprofen (ADVIL,MOTRIN) 200 MG tablet Take 200 mg by mouth every 6 (six) hours as needed.    . raloxifene (EVISTA) 60 MG tablet Take 1 tablet (60 mg total) by mouth daily. 90 tablet 3  . SUMAtriptan (IMITREX) 50 MG tablet Take 1 tablet (50 mg total) by mouth every 2 (two) hours as needed for migraine. May repeat in 2 hours if headache persists or recurs. 10 tablet 0  . topiramate (TOPAMAX) 50 MG tablet Take 1 tablet (50 mg total) by mouth 2 (two) times daily.     No current facility-administered medications for this visit.    PHYSICAL EXAMINATION: ECOG PERFORMANCE STATUS: 1 - Symptomatic but completely ambulatory  Vitals:   04/21/21 1120  BP: 121/66  Pulse: 70  Resp: 20  Temp: (!) 97.5 F (36.4 C)  SpO2: 99%   Filed Weights   04/21/21 1120   Weight: 206 lb 14.4 oz (93.8 kg)    BREAST: No palpable masses or nodules in either right or left breasts. No palpable axillary supraclavicular or infraclavicular adenopathy no breast tenderness or nipple discharge. 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  Breast cancer surveillance: 1.  Mammogram 03/16/2021: No evidence of malignancy breast density category C 2.  Breast MRI 02/26/2020: 0.6 cm enhancing mass left breast, MRI guided biopsy: Fibrocystic change   Patient lives in [place]. 3.  Breast exam 04/21/2021:  Benign  We will arrange for a breast MRI to be done in August 2022. After this MRI we will plan to do MRIs every 2 years.   Orders Placed This Encounter  Procedures  . MR BREAST BILATERAL W WO CONTRAST INC CAD    Standing Status:   Future    Standing Expiration Date:   04/21/2022    Order Specific Question:   If indicated for the ordered procedure, I authorize the administration of contrast media per Radiology protocol    Answer:   Yes    Order Specific Question:   What is the patient's sedation requirement?    Answer:   No Sedation    Order Specific Question:   Does the patient have a pacemaker or  implanted devices?    Answer:   No    Order Specific Question:   Preferred imaging location?    Answer:   [facility] (table limit-550lbs)    Order Specific Question:   Release to patient    Answer:   Immediate   The patient has a good understanding of the overall plan. she agrees with it. she will call with any problems that may develop before the next visit here.  Total time spent: 20 mins including face to face time and time spent for planning, charting and coordination of care  Rulon Eisenmenger, MD, MPH 04/21/2021  I, Cloyde Reams Dorshimer, am acting as scribe for Dr. Nicholas Lose.  I have reviewed the above documentation for accuracy and completeness, and I agree with the above.

## 2021-04-20 NOTE — Assessment & Plan Note (Signed)
Left lumpectomy 03/11/2018: Atypical lobular hyperplasia Risk Reduction: Raloxifene started June 2019 Toxicities:Very occasional hot flashes and muscle cramps  Breast cancer surveillance: 1.  Mammogram 03/16/2021: No evidence of malignancy breast density category C 2.  Breast MRI 02/26/2020: 0.6 cm enhancing mass left breast, MRI guided biopsy: Fibrocystic change Radiology recommended another MRI in 6 months. I will set her up for another MRI in August 2021.  After that she can have it once a year. Patient lives in [place]. 3.  Breast exam 04/21/2021: Benign  Bone density:

## 2021-04-21 ENCOUNTER — Inpatient Hospital Stay: Payer: BC Managed Care – PPO | Attending: Hematology and Oncology | Admitting: Hematology and Oncology

## 2021-04-21 ENCOUNTER — Other Ambulatory Visit: Payer: Self-pay

## 2021-04-21 DIAGNOSIS — N6092 Unspecified benign mammary dysplasia of left breast: Secondary | ICD-10-CM

## 2021-04-21 MED ORDER — RALOXIFENE HCL 60 MG PO TABS: 60.0000 mg | ORAL_TABLET | Freq: Every day | ORAL | 3 refills | Status: DC

## 2021-04-21 MED ORDER — CETIRIZINE HCL 10 MG PO TABS: 10.0000 mg | ORAL_TABLET | Freq: Every day | ORAL | Status: AC

## 2021-04-21 MED ORDER — PANTOPRAZOLE SODIUM 20 MG PO TBEC: 20.0000 mg | DELAYED_RELEASE_TABLET | Freq: Every day | ORAL | Status: AC

## 2021-04-26 DIAGNOSIS — M79662 Pain in left lower leg: Secondary | ICD-10-CM | POA: Diagnosis not present

## 2021-04-26 DIAGNOSIS — M7989 Other specified soft tissue disorders: Secondary | ICD-10-CM | POA: Diagnosis not present

## 2021-04-26 DIAGNOSIS — Z6831 Body mass index (BMI) 31.0-31.9, adult: Secondary | ICD-10-CM | POA: Diagnosis not present

## 2021-04-26 DIAGNOSIS — R252 Cramp and spasm: Secondary | ICD-10-CM | POA: Diagnosis not present

## 2021-04-27 ENCOUNTER — Other Ambulatory Visit: Payer: Self-pay

## 2021-04-27 ENCOUNTER — Ambulatory Visit (AMBULATORY_SURGERY_CENTER): Payer: Self-pay

## 2021-04-27 VITALS — Wt 207.0 lb

## 2021-04-27 DIAGNOSIS — Z1211 Encounter for screening for malignant neoplasm of colon: Secondary | ICD-10-CM

## 2021-04-27 MED ORDER — SUTAB 1479-225-188 MG PO TABS: 1.0000 | ORAL_TABLET | ORAL | 0 refills | Status: DC

## 2021-04-27 NOTE — Progress Notes (Signed)
No egg or soy allergy known to patient  No issues with past sedation with any surgeries or procedures---severe PONV Patient denies ever being told they had issues or difficulty with intubation ----severe PONV No FH of Malignant Hyperthermia No diet pills per patient No home 02 use per patient  No blood thinners per patient  Pt denies issues with constipation  No A fib or A flutter  COVID 19 guidelines implemented in PV today with Pt and RN   Coupon given to pt in PV today, Code to Pharmacy and NO PA's for preps discussed with pt in PV today  Discussed with pt there will be an out-of-pocket cost for prep and that varies from $0 to 70 dollars   Due to the COVID-19 pandemic we are asking patients to follow certain guidelines.  Pt aware of COVID protocols and [facility] guidelines

## 2021-05-06 ENCOUNTER — Encounter: Payer: Self-pay | Admitting: Gastroenterology

## 2021-05-06 ENCOUNTER — Other Ambulatory Visit: Payer: Self-pay | Admitting: *Deleted

## 2021-05-06 DIAGNOSIS — I82492 Acute embolism and thrombosis of other specified deep vein of left lower extremity: Secondary | ICD-10-CM

## 2021-05-12 ENCOUNTER — Encounter: Payer: Self-pay | Admitting: Gastroenterology

## 2021-05-12 ENCOUNTER — Other Ambulatory Visit: Payer: Self-pay

## 2021-05-12 ENCOUNTER — Ambulatory Visit (AMBULATORY_SURGERY_CENTER): Payer: BC Managed Care – PPO | Admitting: Gastroenterology

## 2021-05-12 ENCOUNTER — Other Ambulatory Visit: Payer: Self-pay | Admitting: *Deleted

## 2021-05-12 VITALS — BP 125/72 | HR 74 | Temp 97.1°F | Resp 14 | Ht 68.0 in | Wt 207.0 lb

## 2021-05-12 DIAGNOSIS — Z1211 Encounter for screening for malignant neoplasm of colon: Secondary | ICD-10-CM | POA: Diagnosis not present

## 2021-05-12 DIAGNOSIS — D124 Benign neoplasm of descending colon: Secondary | ICD-10-CM

## 2021-05-12 DIAGNOSIS — I82492 Acute embolism and thrombosis of other specified deep vein of left lower extremity: Secondary | ICD-10-CM

## 2021-05-12 MED ORDER — SODIUM CHLORIDE 0.9 % IV SOLN: 500.0000 mL | Freq: Once | INTRAVENOUS | Status: DC

## 2021-05-12 NOTE — Progress Notes (Signed)
Called to room to assist during endoscopic procedure.  Patient ID and intended procedure confirmed with present staff. Received instructions for my participation in the procedure from the performing physician.  

## 2021-05-12 NOTE — Progress Notes (Signed)
Report given to PACU, vss 

## 2021-05-12 NOTE — Op Note (Signed)
[facility] Patient Name: Karen Chung Procedure Date: 05/12/2021 2:08 PM MRN: [id number] Endoscopist: Jackquline Denmark , MD Age: 55 Referring MD:  Date of Birth: [birth year] Gender: Female Account #: [id number] Procedure:                Colonoscopy Indications:              Screening for colorectal malignant neoplasm Medicines:                Monitored Anesthesia Care Procedure:                Pre-Anesthesia Assessment:                           - Prior to the procedure, a History and Physical                            was performed, and patient medications and                            allergies were reviewed. The patient's tolerance of                            previous anesthesia was also reviewed. The risks                            and benefits of the procedure and the sedation                            options and risks were discussed with the patient.                            All questions were answered, and informed consent                            was obtained. Prior Anticoagulants: The patient has                            taken no previous anticoagulant or antiplatelet                            agents. ASA Grade Assessment: II - A patient with                            mild systemic disease. After reviewing the risks                            and benefits, the patient was deemed in                            satisfactory condition to undergo the procedure.                           After obtaining informed consent, the colonoscope  was passed under direct vision. Throughout the                            procedure, the patient's blood pressure, pulse, and                            oxygen saturations were monitored continuously. The                            Olympus [id number]-[id number] (#[id number]) Colonoscope was                            introduced through the anus and advanced to the the                            cecum, identified  by appendiceal orifice and                            ileocecal valve. The colonoscopy was performed                            without difficulty. The patient tolerated the                            procedure well. The quality of the bowel                            preparation was good. The terminal ileum, ileocecal                            valve, appendiceal orifice, and rectum were                            photographed. Scope In: 2:15:34 PM Scope Out: 2:31:42 PM Scope Withdrawal Time: 0 hours 13 minutes 42 seconds  Total Procedure Duration: 0 hours 16 minutes 8 seconds  Findings:                 A 6 mm polyp was found in the mid descending colon.                            The polyp was sessile. The polyp was removed with a                            cold snare. Resection and retrieval were complete.                           Non-bleeding internal hemorrhoids were found during                            retroflexion. The hemorrhoids were small.                           The terminal ileum appeared normal.  The exam was otherwise without abnormality on                            direct and retroflexion views. The colon was highly                            redundant. Complications:            No immediate complications. Estimated Blood Loss:     Estimated blood loss: none. Impression:               - One 6 mm polyp in the mid descending colon,                            removed with a cold snare. Resected and retrieved.                           - Non-bleeding internal hemorrhoids.                           - The examined portion of the ileum was normal.                           - The examination was otherwise normal on direct                            and retroflexion views. Recommendation:           - Patient has a contact number available for                            emergencies. The signs and symptoms of potential                             delayed complications were discussed with the                            patient. Return to normal activities tomorrow.                            Written discharge instructions were provided to the                            patient.                           - Resume high-fiber diet.                           - Continue present medications.                           - Await pathology results.                           - Repeat colonoscopy for surveillance based on  pathology results.                           - The findings and recommendations were discussed                            with the patient's family. Jackquline Denmark, MD 05/12/2021 2:36:34 PM This report has been signed electronically.

## 2021-05-12 NOTE — Patient Instructions (Signed)
Handouts on polyps and hemorrhoids given to you today   YOU HAD AN ENDOSCOPIC PROCEDURE TODAY AT [facility]:   Refer to the procedure report that was given to you for any specific questions about what was found during the examination.  If the procedure report does not answer your questions, please call your gastroenterologist to clarify.  If you requested that your care partner not be given the details of your procedure findings, then the procedure report has been included in a sealed envelope for you to review at your convenience later.  YOU SHOULD EXPECT: Some feelings of bloating in the abdomen. Passage of more gas than usual.  Walking can help get rid of the air that was put into your GI tract during the procedure and reduce the bloating. If you had a lower endoscopy (such as a colonoscopy or flexible sigmoidoscopy) you may notice spotting of blood in your stool or on the toilet paper. If you underwent a bowel prep for your procedure, you may not have a normal bowel movement for a few days.  Please Note:  You might notice some irritation and congestion in your nose or some drainage.  This is from the oxygen used during your procedure.  There is no need for concern and it should clear up in a day or so.  SYMPTOMS TO REPORT IMMEDIATELY:   Following lower endoscopy (colonoscopy or flexible sigmoidoscopy):  Excessive amounts of blood in the stool  Significant tenderness or worsening of abdominal pains  Swelling of the abdomen that is new, acute  Fever of 100F or higher  For urgent or emergent issues, a gastroenterologist can be reached at any hour by calling [phone]. Do not use MyChart messaging for urgent concerns.    DIET:  We do recommend a small meal at first, but then you may proceed to your regular diet.  Drink plenty of fluids but you should avoid alcoholic beverages for 24 hours.  ACTIVITY:  You should plan to take it easy for the rest of today and you should  NOT DRIVE or use heavy machinery until tomorrow (because of the sedation medicines used during the test).    FOLLOW UP: Our staff will call the number listed on your records 48-72 hours following your procedure to check on you and address any questions or concerns that you may have regarding the information given to you following your procedure. If we do not reach you, we will leave a message.  We will attempt to reach you two times.  During this call, we will ask if you have developed any symptoms of COVID 19. If you develop any symptoms (ie: fever, flu-like symptoms, shortness of breath, cough etc.) before then, please call [phone].  If you test positive for Covid 19 in the 2 weeks post procedure, please call and report this information to [place].    If any biopsies were taken you will be contacted by phone or by letter within the next 1-3 weeks.  Please call us at [phone] if you have not heard about the biopsies in 3 weeks.    SIGNATURES/CONFIDENTIALITY: You and/or your care partner have signed paperwork which will be entered into your electronic medical record.  These signatures attest to the fact that that the information above on your After Visit Summary has been reviewed and is understood.  Full responsibility of the confidentiality of this discharge information lies with you and/or your care-partner.

## 2021-05-17 ENCOUNTER — Telehealth: Payer: Self-pay | Admitting: *Deleted

## 2021-05-17 ENCOUNTER — Telehealth: Payer: Self-pay

## 2021-05-17 DIAGNOSIS — J028 Acute pharyngitis due to other specified organisms: Secondary | ICD-10-CM | POA: Diagnosis not present

## 2021-05-17 DIAGNOSIS — J209 Acute bronchitis, unspecified: Secondary | ICD-10-CM | POA: Diagnosis not present

## 2021-05-17 DIAGNOSIS — B9689 Other specified bacterial agents as the cause of diseases classified elsewhere: Secondary | ICD-10-CM | POA: Diagnosis not present

## 2021-05-17 DIAGNOSIS — R0981 Nasal congestion: Secondary | ICD-10-CM | POA: Diagnosis not present

## 2021-05-17 NOTE — Telephone Encounter (Signed)
Called #[phone] and left a message we tried to reach pt for a follow up call. maw

## 2021-05-17 NOTE — Telephone Encounter (Signed)
  Follow up Call-  Call back number 05/12/2021  Post procedure Call Back phone  # [phone]  Permission to leave phone message Yes  Some recent data might be hidden     Patient questions:  Do you have a fever, pain , or abdominal swelling? No. Pain Score  0 *  Have you tolerated food without any problems? Yes.    Have you been able to return to your normal activities? Yes.    Do you have any questions about your discharge instructions: Diet   No. Medications  No. Follow up visit  No.  Do you have questions or concerns about your Care? No.  Actions: * If pain score is 4 or above: No action needed, pain <4.  1. Have you developed a fever since your procedure? no  2.   Have you had an respiratory symptoms (SOB or cough) since your procedure? no  3.   Have you tested positive for COVID 19 since your procedure no  4.   Have you had any family members/close contacts diagnosed with the COVID 19 since your procedure?  no   If yes to any of these questions please route to Joylene John, RN and Joella Prince, RN

## 2021-05-21 ENCOUNTER — Encounter: Payer: Self-pay | Admitting: Gastroenterology

## 2021-05-31 ENCOUNTER — Ambulatory Visit: Payer: BC Managed Care – PPO | Admitting: Vascular Surgery

## 2021-05-31 ENCOUNTER — Other Ambulatory Visit: Payer: Self-pay

## 2021-05-31 ENCOUNTER — Ambulatory Visit (HOSPITAL_COMMUNITY)
Admission: RE | Admit: 2021-05-31 | Discharge: 2021-05-31 | Disposition: A | Discharge status: Home / Self Care | Payer: BC Managed Care – PPO | Source: Ambulatory Visit | Attending: Vascular Surgery | Admitting: Vascular Surgery

## 2021-05-31 ENCOUNTER — Encounter: Payer: Self-pay | Admitting: Vascular Surgery

## 2021-05-31 DIAGNOSIS — I872 Venous insufficiency (chronic) (peripheral): Secondary | ICD-10-CM

## 2021-05-31 DIAGNOSIS — I82492 Acute embolism and thrombosis of other specified deep vein of left lower extremity: Secondary | ICD-10-CM | POA: Insufficient documentation

## 2021-05-31 DIAGNOSIS — I8393 Asymptomatic varicose veins of bilateral lower extremities: Secondary | ICD-10-CM

## 2021-05-31 NOTE — Progress Notes (Signed)
Patient name: Karen Chung MRN: [id number] DOB: 03-18-1966 Sex: female  REASON FOR CONSULT: Evaluate left lower extremity swelling  HPI: Karen Chung is a 55 y.o. female, that presents for evaluation of left lower extremity swelling and possible underlying venous insufficiency.  Patient was a Pharmacist, hospital in [place] and had noticed swelling in the left leg particularly worse at the end of the day for years.  This has gotten much worse over the last several months.  She has a lot of fatigue and heaviness in the leg at the end of the day.  She is not currently wearing compression stockings.  No hx of leg DVT or trauma.  She has had some sclerotherapy for cosmetic reasons in [place] to both lower extremities.  Past Medical History:  Diagnosis Date   Abnormal cells in breast    Atypical in Left   Allergy    seasonal allergies   Cancer (HCC)    skin CA-removed   Complication of anesthesia    GERD (gastroesophageal reflux disease)    on meds   Headache    PONV (postoperative nausea and vomiting)     Past Surgical History:  Procedure Laterality Date   BREAST EXCISIONAL BIOPSY Left 03/08/2018   [place]   BREAST LUMPECTOMY WITH RADIOACTIVE SEED LOCALIZATION Left 03/08/2018   Procedure: LEFT BREAST LUMPECTOMY WITH RADIOACTIVE SEED LOCALIZATION ERAS PATHWAY;  Surgeon: Coralie Keens, MD;  Location: [place] [facility];  Service: General;  Laterality: Left;   CHOLECYSTECTOMY     10/2010   TONSILLECTOMY      Family History  Problem Relation Age of Onset   High blood pressure Mother    Leukemia Father    Colon polyps Neg Hx    Colon cancer Neg Hx    Esophageal cancer Neg Hx    Stomach cancer Neg Hx    Rectal cancer Neg Hx     SOCIAL HISTORY: Social History   Socioeconomic History   Marital status: Married    Spouse name: Not on file   Number of children: Not on file   Years of education: Not on file   Highest education level: Not on file  Occupational History   Not on file  Tobacco  Use   Smoking status: Never   Smokeless tobacco: Never  Vaping Use   Vaping Use: Never used  Substance and Sexual Activity   Alcohol use: No   Drug use: No   Sexual activity: Not on file  Other Topics Concern   Not on file  Social History Narrative   Not on file   Social Determinants of Health   Financial Resource Strain: Not on file  Food Insecurity: Not on file  Transportation Needs: Not on file  Physical Activity: Not on file  Stress: Not on file  Social Connections: Not on file  Intimate Partner Violence: Not on file    Allergies  Allergen Reactions   Codeine Nausea Only and Swelling    TONGUE SWELLING   Erythromycin Nausea Only and Swelling    TONGUE SWELLING    Current Outpatient Medications  Medication Sig Dispense Refill   B Complex-C-Folic Acid (SM B SUPER VITAMIN COMPLEX PO) Take 1 tablet by mouth daily at 6 (six) AM.     buPROPion (WELLBUTRIN XL) 150 MG 24 hr tablet Take 1 tablet (150 mg total) by mouth daily.     cetirizine (ZYRTEC ALLERGY) 10 MG tablet Take 1 tablet (10 mg total) by mouth daily.  ibuprofen (ADVIL,MOTRIN) 200 MG tablet Take 200 mg by mouth every 6 (six) hours as needed.     Multiple Vitamin (MULTIVITAMIN PO) Take 1 tablet by mouth daily at 6 (six) AM.     Omega-3 Fatty Acids (FISH OIL PO) Take 1,200 mg by mouth daily.     pantoprazole (PROTONIX) 20 MG tablet Take 1 tablet (20 mg total) by mouth daily.     Probiotic Product (PROBIOTIC PO) Take 1 tablet by mouth daily.     raloxifene (EVISTA) 60 MG tablet Take 1 tablet (60 mg total) by mouth daily. 90 tablet 3   SUMAtriptan (IMITREX) 50 MG tablet Take 1 tablet (50 mg total) by mouth every 2 (two) hours as needed for migraine. May repeat in 2 hours if headache persists or recurs. 10 tablet 0   topiramate (TOPAMAX) 50 MG tablet Take 1 tablet (50 mg total) by mouth 2 (two) times daily.     VITAMIN D PO Take 125 mcg by mouth daily at 6 (six) AM.     No current facility-administered medications  for this visit.    REVIEW OF SYSTEMS:  [X]  denotes positive finding, [ ]  denotes negative finding Cardiac  Comments:  Chest pain or chest pressure:    Shortness of breath upon exertion:    Short of breath when lying flat:    Irregular heart rhythm:        Vascular    Pain in calf, thigh, or hip brought on by ambulation:    Pain in feet at night that wakes you up from your sleep:     Blood clot in your veins:    Leg swelling:  x left      Pulmonary    Oxygen at home:    Productive cough:     Wheezing:         Neurologic    Sudden weakness in arms or legs:     Sudden numbness in arms or legs:     Sudden onset of difficulty speaking or slurred speech:    Temporary loss of vision in one eye:     Problems with dizziness:         Gastrointestinal    Blood in stool:     Vomited blood:         Genitourinary    Burning when urinating:     Blood in urine:        Psychiatric    Major depression:         Hematologic    Bleeding problems:    Problems with blood clotting too easily:        Skin    Rashes or ulcers:        Constitutional    Fever or chills:      PHYSICAL EXAM: Vitals:   05/31/21 1112  BP: 129/73  Pulse: 70  Resp: 18  Temp: 98.2 F (36.8 C)  TempSrc: Temporal  SpO2: 97%  Weight: 204 lb (92.5 kg)  Height: 5\' 7"  (1.702 m)    GENERAL: The patient is a well-nourished female, in no acute distress. The vital signs are documented above. CARDIAC: There is a regular rate and rhythm.  VASCULAR:  Palpable PT pulses bilaterally Bilateral lower extremity spider veins but no obvious large varicosities No skin thickening Left leg edema appreciable PULMONARY: There is good air exchange bilaterally without wheezing or rales. ABDOMEN: Soft and non-tender. MUSCULOSKELETAL: There are no major deformities or cyanosis. NEUROLOGIC: No focal weakness or paresthesias  are detected. SKIN: There are no ulcers or rashes noted. PSYCHIATRIC: The patient has a normal  affect.  DATA:   Indications: Swelling.      Comparison Study: 04/26/2021: (outside facility) No evidence of deep vein                    thrombosis in the left lower extremity.   Performing Technologist: Ivan Croft      Examination Guidelines: A complete evaluation includes B-mode imaging,  spectral  Doppler, color Doppler, and power Doppler as needed of all accessible  portions  of each vessel. Bilateral testing is considered an integral part of a  complete  examination. Limited examinations for reoccurring indications may be  performed  as noted. The reflux portion of the exam is performed with the patient in  reverse Trendelenburg.  Significant venous reflux is defined as >500 ms in the superficial venous  system, and >1 second in the deep venous system.      +--------------+---------+------+-----------+------------+--------+  LEFT          Reflux NoRefluxReflux TimeDiameter cmsComments                          Yes                                   +--------------+---------+------+-----------+------------+--------+  CFV           no                                              +--------------+---------+------+-----------+------------+--------+  FV mid        no                                              +--------------+---------+------+-----------+------------+--------+  Popliteal     no                                              +--------------+---------+------+-----------+------------+--------+  GSV at [facility]    no                            0.73              +--------------+---------+------+-----------+------------+--------+  GSV prox thigh          yes    >500 ms      0.65              +--------------+---------+------+-----------+------------+--------+  GSV mid thigh           yes    >500 ms      0.49              +--------------+---------+------+-----------+------------+--------+  GSV dist thigh           yes    >500 ms      0.48              +--------------+---------+------+-----------+------------+--------+  GSV at knee  yes    >500 ms      0.67              +--------------+---------+------+-----------+------------+--------+  GSV prox calf           yes    >500 ms      0.54              +--------------+---------+------+-----------+------------+--------+  SSV Pop Fossa           yes    >500 ms      0.53              +--------------+---------+------+-----------+------------+--------+  SSV prox calf           yes    >500 ms      0.37              +--------------+---------+------+-----------+------------+--------+  SSV mid calf            yes    >500 ms      0.26              +--------------+---------+------+-----------+------------+--------+           Summary:  Left:  - No evidence of deep vein thrombosis seen in the left lower extremity,  from the common femoral through the popliteal veins.  - No evidence of superficial venous thrombosis in the left lower  extremity.     - Venous reflux is noted in the left greater saphenous vein in the thigh.  - Venous reflux is noted in the left greater saphenous vein in the calf.  - Venous reflux is noted in the left short saphenous vein.     *See table(s) above for measurements and observations.   Electronically signed by Monica Martinez MD on 05/31/2021 at 11:17:59 AM.  Assessment/Plan:  55 year old female with venous insufficiency in the left lower extremity consistent with CEAP classification C3 given edema.  She has long segment superficial reflux throughout the great saphenous and small saphenous.  Discussed starting with medical therapy including leg elevation, compression stockings, and exercise.  We will get her sized for thigh-high stockings today with 20-30 mm Hg.  I think she potentially would be a candidate for laser ablation given she does have long segment superficial reflux  throughout the great saphenous vein that measures 5 to 7 mm.  Discussed duplex today did not show any reflux at the junction but I think it would be reasonable to bring her back in 3 months and reevaluate this given the severity of her symptoms.   Marty Heck, MD [facility] Office: [phone]

## 2021-07-21 DIAGNOSIS — L918 Other hypertrophic disorders of the skin: Secondary | ICD-10-CM | POA: Diagnosis not present

## 2021-07-21 DIAGNOSIS — D485 Neoplasm of uncertain behavior of skin: Secondary | ICD-10-CM | POA: Diagnosis not present

## 2021-07-21 DIAGNOSIS — L718 Other rosacea: Secondary | ICD-10-CM | POA: Diagnosis not present

## 2021-07-21 DIAGNOSIS — D2262 Melanocytic nevi of left upper limb, including shoulder: Secondary | ICD-10-CM | POA: Diagnosis not present

## 2021-07-21 DIAGNOSIS — D225 Melanocytic nevi of trunk: Secondary | ICD-10-CM | POA: Diagnosis not present

## 2021-07-21 DIAGNOSIS — C44519 Basal cell carcinoma of skin of other part of trunk: Secondary | ICD-10-CM | POA: Diagnosis not present

## 2021-08-11 ENCOUNTER — Telehealth: Payer: Self-pay | Admitting: Hematology and Oncology

## 2021-08-11 ENCOUNTER — Ambulatory Visit
Admission: RE | Admit: 2021-08-11 | Discharge: 2021-08-11 | Disposition: A | Discharge status: Home / Self Care | Payer: BC Managed Care – PPO | Source: Ambulatory Visit | Attending: Hematology and Oncology | Admitting: Hematology and Oncology

## 2021-08-11 ENCOUNTER — Other Ambulatory Visit: Payer: Self-pay

## 2021-08-11 DIAGNOSIS — Z803 Family history of malignant neoplasm of breast: Secondary | ICD-10-CM | POA: Diagnosis not present

## 2021-08-11 DIAGNOSIS — N6092 Unspecified benign mammary dysplasia of left breast: Secondary | ICD-10-CM

## 2021-08-11 MED ORDER — GADOBUTROL 1 MMOL/ML IV SOLN
10.0000 mL | Freq: Once | INTRAVENOUS | Status: AC | PRN
  Administered 2021-08-11: 10 mL via INTRAVENOUS

## 2021-08-11 NOTE — Telephone Encounter (Signed)
I informed the patient that her breast MRI is normal.

## 2021-09-07 ENCOUNTER — Encounter: Payer: Self-pay | Admitting: Vascular Surgery

## 2021-09-07 ENCOUNTER — Ambulatory Visit: Payer: BC Managed Care – PPO | Admitting: Vascular Surgery

## 2021-09-07 ENCOUNTER — Other Ambulatory Visit: Payer: Self-pay

## 2021-09-07 VITALS — BP 122/84 | HR 83 | Temp 98.1°F | Resp 18 | Ht 67.0 in | Wt 208.2 lb

## 2021-09-07 DIAGNOSIS — I872 Venous insufficiency (chronic) (peripheral): Secondary | ICD-10-CM | POA: Diagnosis not present

## 2021-09-07 NOTE — Progress Notes (Signed)
REASON FOR VISIT:   Follow-up of left lower extremity swelling.  MEDICAL ISSUES:   CHRONIC VENOUS INSUFFICIENCY: This patient has some mild left lower extremity swelling and the only significant finding on her duplex is some reflux in the left great saphenous vein.  There is no reflux however at the saphenofemoral junction.  We have discussed the importance of intermittent leg elevation and the proper positioning for this.  I have encouraged her to continue to wear her compression stockings.  We discussed the importance of exercise specifically walking and water aerobics.  I have encouraged her to avoid prolonged sitting and standing.  We also discussed the importance of maintaining a healthy weight.  Currently I am not sure that laser ablation of the left great saphenous vein would have a significant impact on her swelling as the vein is markedly dilated and there is no reflux at the saphenofemoral junction.  The only other consideration would be a May Thurner syndrome but I have not recommended a CT venogram at this point given her young age.  I think taking an aggressive approach to this if she did in fact have some narrowing of the iliac vein would be associated with some risk.  If her swelling or symptoms progress in the future I think it be worth repeating her venous duplex scan.  She will call if her symptoms progress.   HPI:   Karen Chung is a pleasant 55 y.o. female who was seen by Dr. Carlis Abbott on 05/31/2021 with left lower extremity swelling and chronic venous insufficiency.  Her swelling is worse at the end of the day.  This is gotten worse over the last several months.  She was also complaining of heaviness and fatigue in the left leg.  She has no previous history of DVT.  On exam she had palpable posterior tibial pulses bilaterally.  She had some spider veins but no varicose veins.  She had significant left lower extremity swelling.  It was felt that she had CEAP C3 venous disease.   She had significant reflux in the left great saphenous vein and also reflux in the small saphenous vein.  She was fitted for thigh-high compression stockings with a gradient of 20 to 30 mmHg.  She was encouraged to elevate her legs and exercise.  She comes in for 17-month follow-up visit.  Since she was seen last, she is continuing to have some left lower extremity swelling.  She describes an aching pain and heaviness.  He left leg which is aggravated by standing and sitting and relieved with elevation.  She has been wearing her thigh-high compression stockings with a gradient of 20 to 30 mmHg.  Her swelling has been going on for over a year.  It is been worse over the last several months.  Past Medical History:  Diagnosis Date   Abnormal cells in breast    Atypical in Left   Allergy    seasonal allergies   Cancer (HCC)    skin CA-removed   Complication of anesthesia    GERD (gastroesophageal reflux disease)    on meds   Headache    PONV (postoperative nausea and vomiting)     Family History  Problem Relation Age of Onset   High blood pressure Mother    Leukemia Father    Colon polyps Neg Hx    Colon cancer Neg Hx    Esophageal cancer Neg Hx    Stomach cancer Neg Hx    Rectal  cancer Neg Hx     SOCIAL HISTORY: Social History   Tobacco Use   Smoking status: Never   Smokeless tobacco: Never  Substance Use Topics   Alcohol use: No    Allergies  Allergen Reactions   Codeine Nausea Only and Swelling    TONGUE SWELLING   Erythromycin Nausea Only and Swelling    TONGUE SWELLING    Current Outpatient Medications  Medication Sig Dispense Refill   azelastine (ASTELIN) 0.1 % nasal spray Place into both nostrils as needed for rhinitis. Use in each nostril as directed     B Complex-C-Folic Acid (SM B SUPER VITAMIN COMPLEX PO) Take 1 tablet by mouth daily at 6 (six) AM.     buPROPion (WELLBUTRIN XL) 150 MG 24 hr tablet Take 1 tablet (150 mg total) by mouth daily.     cetirizine  (ZYRTEC ALLERGY) 10 MG tablet Take 1 tablet (10 mg total) by mouth daily.     ibuprofen (ADVIL,MOTRIN) 200 MG tablet Take 200 mg by mouth every 6 (six) hours as needed.     Multiple Vitamin (MULTIVITAMIN PO) Take 1 tablet by mouth daily at 6 (six) AM.     Omega-3 Fatty Acids (FISH OIL PO) Take 1,200 mg by mouth daily.     pantoprazole (PROTONIX) 20 MG tablet Take 1 tablet (20 mg total) by mouth daily.     Probiotic Product (PROBIOTIC PO) Take 1 tablet by mouth daily.     raloxifene (EVISTA) 60 MG tablet Take 1 tablet (60 mg total) by mouth daily. 90 tablet 3   SUMAtriptan (IMITREX) 50 MG tablet Take 1 tablet (50 mg total) by mouth every 2 (two) hours as needed for migraine. May repeat in 2 hours if headache persists or recurs. 10 tablet 0   topiramate (TOPAMAX) 50 MG tablet Take 1 tablet (50 mg total) by mouth 2 (two) times daily.     VITAMIN D PO Take 125 mcg by mouth daily at 6 (six) AM.     No current facility-administered medications for this visit.    REVIEW OF SYSTEMS:  [X]  denotes positive finding, [ ]  denotes negative finding Cardiac  Comments:  Chest pain or chest pressure:    Shortness of breath upon exertion:    Short of breath when lying flat:    Irregular heart rhythm:        Vascular    Pain in calf, thigh, or hip brought on by ambulation:    Pain in feet at night that wakes you up from your sleep:     Blood clot in your veins:    Leg swelling:  x Left leg      Pulmonary    Oxygen at home:    Productive cough:     Wheezing:         Neurologic    Sudden weakness in arms or legs:     Sudden numbness in arms or legs:     Sudden onset of difficulty speaking or slurred speech:    Temporary loss of vision in one eye:     Problems with dizziness:         Gastrointestinal    Blood in stool:     Vomited blood:         Genitourinary    Burning when urinating:     Blood in urine:        Psychiatric    Major depression:         Hematologic  Bleeding problems:     Problems with blood clotting too easily:        Skin    Rashes or ulcers:        Constitutional    Fever or chills:     PHYSICAL EXAM:   Vitals:   09/07/21 1556  BP: 122/84  Pulse: 83  Resp: 18  Temp: 98.1 F (36.7 C)  TempSrc: Temporal  SpO2: 100%  Weight: 208 lb 3.2 oz (94.4 kg)  Height: 5\' 7"  (1.702 m)    GENERAL: The patient is a well-nourished female, in no acute distress. The vital signs are documented above. CARDIAC: There is a regular rate and rhythm.  VASCULAR: I do not detect carotid bruits. She has palpable pedal pulses. She has mild left lower extremity swelling. I did look at her left great saphenous vein myself with the SonoSite.  She has reflux down to the proximal calf however I did not demonstrate reflux at the saphenofemoral junction.  Diameters of the vein ranged from 5 to 7 mm. PULMONARY: There is good air exchange bilaterally without wheezing or rales. ABDOMEN: Soft and non-tender with normal pitched bowel sounds.  MUSCULOSKELETAL: There are no major deformities or cyanosis. NEUROLOGIC: No focal weakness or paresthesias are detected. SKIN: There are no ulcers or rashes noted. PSYCHIATRIC: The patient has a normal affect.  DATA:    VENOUS DUPLEX: I reviewed her venous duplex scan that was done on 05/31/2021 of the left lower extremity.  There is no evidence of DVT.  There is no deep venous reflux.  There is superficial venous reflux in the left great saphenous vein from the proximal thigh to the proximal calf.  Diameters of the vein ranged from 5 to 7 mm.  There is no reflux in the saphenofemoral junction.     Deitra Mayo [facility] [phone]

## 2021-09-08 ENCOUNTER — Ambulatory Visit: Payer: BC Managed Care – PPO | Admitting: Vascular Surgery

## 2021-09-15 DIAGNOSIS — C4491 Basal cell carcinoma of skin, unspecified: Secondary | ICD-10-CM | POA: Diagnosis not present

## 2021-09-15 DIAGNOSIS — L918 Other hypertrophic disorders of the skin: Secondary | ICD-10-CM | POA: Diagnosis not present

## 2021-09-15 DIAGNOSIS — L988 Other specified disorders of the skin and subcutaneous tissue: Secondary | ICD-10-CM | POA: Diagnosis not present

## 2021-10-10 DIAGNOSIS — C50912 Malignant neoplasm of unspecified site of left female breast: Secondary | ICD-10-CM | POA: Diagnosis not present

## 2021-10-10 DIAGNOSIS — M7989 Other specified soft tissue disorders: Secondary | ICD-10-CM | POA: Diagnosis not present

## 2021-10-10 DIAGNOSIS — F4323 Adjustment disorder with mixed anxiety and depressed mood: Secondary | ICD-10-CM | POA: Diagnosis not present

## 2021-10-10 DIAGNOSIS — K219 Gastro-esophageal reflux disease without esophagitis: Secondary | ICD-10-CM | POA: Diagnosis not present

## 2021-10-10 DIAGNOSIS — Z79810 Long term (current) use of selective estrogen receptor modulators (SERMs): Secondary | ICD-10-CM | POA: Diagnosis not present

## 2021-12-29 DIAGNOSIS — Z20828 Contact with and (suspected) exposure to other viral communicable diseases: Secondary | ICD-10-CM | POA: Diagnosis not present

## 2021-12-29 DIAGNOSIS — H65193 Other acute nonsuppurative otitis media, bilateral: Secondary | ICD-10-CM | POA: Diagnosis not present

## 2021-12-29 DIAGNOSIS — R0981 Nasal congestion: Secondary | ICD-10-CM | POA: Diagnosis not present

## 2021-12-29 DIAGNOSIS — J320 Chronic maxillary sinusitis: Secondary | ICD-10-CM | POA: Diagnosis not present

## 2022-02-21 DIAGNOSIS — J00 Acute nasopharyngitis [common cold]: Secondary | ICD-10-CM | POA: Diagnosis not present

## 2022-02-21 DIAGNOSIS — Z6832 Body mass index (BMI) 32.0-32.9, adult: Secondary | ICD-10-CM | POA: Diagnosis not present

## 2022-04-07 NOTE — Progress Notes (Incomplete)
? ?Patient Care Team: ?Serita Grammes, MD as PCP - General (Family Medicine) ? ?DIAGNOSIS: No diagnosis found. ? ?SUMMARY OF ONCOLOGIC HISTORY: ?Oncology History  ? No history exists.  ? ? ?CHIEF COMPLIANT: Follow-up of high risk for breast cancer on raloxifene ? ?INTERVAL HISTORY: Karen Chung is a ?56 y.o. with above-mentioned history of atypical lobular hyperplasia who is on risk reduction therapy with raloxifene. Mammogram on 03/15/21 showed no evidence of malignancy bilaterally. She presents to the clinic today for follow-up. ? ?ALLERGIES:  is allergic to codeine and erythromycin. ? ?MEDICATIONS:  ?Current Outpatient Medications  ?Medication Sig Dispense Refill  ? azelastine (ASTELIN) 0.1 % nasal spray Place into both nostrils as needed for rhinitis. Use in each nostril as directed    ? B Complex-C-Folic Acid (SM B SUPER VITAMIN COMPLEX PO) Take 1 tablet by mouth daily at 6 (six) AM.    ? buPROPion (WELLBUTRIN XL) 150 MG 24 hr tablet Take 1 tablet (150 mg total) by mouth daily.    ? cetirizine (ZYRTEC ALLERGY) 10 MG tablet Take 1 tablet (10 mg total) by mouth daily.    ? ibuprofen (ADVIL,MOTRIN) 200 MG tablet Take 200 mg by mouth every 6 (six) hours as needed.    ? Multiple Vitamin (MULTIVITAMIN PO) Take 1 tablet by mouth daily at 6 (six) AM.    ? Omega-3 Fatty Acids (FISH OIL PO) Take 1,200 mg by mouth daily.    ? pantoprazole (PROTONIX) 20 MG tablet Take 1 tablet (20 mg total) by mouth daily.    ? Probiotic Product (PROBIOTIC PO) Take 1 tablet by mouth daily.    ? raloxifene (EVISTA) 60 MG tablet Take 1 tablet (60 mg total) by mouth daily. 90 tablet 3  ? SUMAtriptan (IMITREX) 50 MG tablet Take 1 tablet (50 mg total) by mouth every 2 (two) hours as needed for migraine. May repeat in 2 hours if headache persists or recurs. 10 tablet 0  ? topiramate (TOPAMAX) 50 MG tablet Take 1 tablet (50 mg total) by mouth 2 (two) times daily.    ? VITAMIN D PO Take 125 mcg by mouth daily at 6 (six) AM.    ? ?No current  facility-administered medications for this visit.  ? ? ?PHYSICAL EXAMINATION: ?ECOG PERFORMANCE STATUS: {CHL ONC ECOG TW:[id number]} ? ?There were no vitals filed for this visit. ?There were no vitals filed for this visit. ? ?BREAST:*** No palpable masses or nodules in either right or left breasts. No palpable axillary supraclavicular or infraclavicular adenopathy no breast tenderness or nipple discharge. (exam performed in the presence of a chaperone) ? ?LABORATORY DATA:  ?I have reviewed the data as listed ? ?  Latest Ref Rng & Units 03/06/2018  ? 11:46 AM  ?CMP  ?Glucose 65 - 99 mg/dL 94    ?BUN 6 - 20 mg/dL 7    ?Creatinine 0.44 - 1.00 mg/dL 0.68    ?Sodium 135 - 145 mmol/L 138    ?Potassium 3.5 - 5.1 mmol/L 3.4    ?Chloride 101 - 111 mmol/L 107    ?CO2 22 - 32 mmol/L 23    ?Calcium 8.9 - 10.3 mg/dL 8.7    ? ? ?Lab Results  ?Component Value Date  ? WBC 9.2 03/06/2018  ? HGB 13.8 03/06/2018  ? HCT 40.4 03/06/2018  ? MCV 89.0 03/06/2018  ? PLT 276 03/06/2018  ? ? ?ASSESSMENT & PLAN:  ?No problem-specific Assessment & Plan notes found for this encounter. ? ? ? ?No orders of  the defined types were placed in this encounter. ? ?The patient has a good understanding of the overall plan. she agrees with it. she will call with any problems that may develop before the next visit here. ?Total time spent: 30 mins including face to face time and time spent for planning, charting and co-ordination of care ? ? Suzzette Righter, CMA ?04/07/22 ? ? ? I Gardiner Coins am scribing for Dr. Lindi Adie ? ?***  ?

## 2022-04-10 ENCOUNTER — Other Ambulatory Visit: Payer: Self-pay | Admitting: Hematology and Oncology

## 2022-04-10 DIAGNOSIS — Z1231 Encounter for screening mammogram for malignant neoplasm of breast: Secondary | ICD-10-CM

## 2022-04-13 DIAGNOSIS — Z0000 Encounter for general adult medical examination without abnormal findings: Secondary | ICD-10-CM | POA: Diagnosis not present

## 2022-04-13 DIAGNOSIS — Z6832 Body mass index (BMI) 32.0-32.9, adult: Secondary | ICD-10-CM | POA: Diagnosis not present

## 2022-04-13 DIAGNOSIS — Z13220 Encounter for screening for lipoid disorders: Secondary | ICD-10-CM | POA: Diagnosis not present

## 2022-04-17 ENCOUNTER — Telehealth: Payer: Self-pay | Admitting: Hematology and Oncology

## 2022-04-17 NOTE — Telephone Encounter (Signed)
Per 5/1 in basket called and spoke to pt about rescheduling her appointment.  Pt confirmed appointment  ?

## 2022-04-18 ENCOUNTER — Ambulatory Visit: Payer: BC Managed Care – PPO

## 2022-04-21 ENCOUNTER — Inpatient Hospital Stay: Payer: BC Managed Care – PPO | Admitting: Hematology and Oncology

## 2022-05-01 NOTE — Progress Notes (Signed)
Patient Care Team: Serita Grammes, MD as PCP - General (Family Medicine)  DIAGNOSIS:  Encounter Diagnosis  Name Primary?   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 with above-mentioned history of atypical lobular hyperplasia who is on risk reduction therapy with raloxifene. She presents to the clinic today for follow-up. Denies any side effects. She had a breast mammogram that showed slight asymmetry and masslike change which is going to be further evaluated with another mammogram on 05/16/2022.  She is very anxious about the findings.  ALLERGIES:  is allergic to codeine and erythromycin.  MEDICATIONS:  Current Outpatient Medications  Medication Sig Dispense Refill   azelastine (ASTELIN) 0.1 % nasal spray Place into both nostrils as needed for rhinitis. Use in each nostril as directed     B Complex-C-Folic Acid (SM B SUPER VITAMIN COMPLEX PO) Take 1 tablet by mouth daily at 6 (six) AM.     buPROPion (WELLBUTRIN XL) 150 MG 24 hr tablet Take 1 tablet (150 mg total) by mouth daily.     cetirizine (ZYRTEC ALLERGY) 10 MG tablet Take 1 tablet (10 mg total) by mouth daily.     ibuprofen (ADVIL,MOTRIN) 200 MG tablet Take 200 mg by mouth every 6 (six) hours as needed.     Multiple Vitamin (MULTIVITAMIN PO) Take 1 tablet by mouth daily at 6 (six) AM.     Omega-3 Fatty Acids (FISH OIL PO) Take 1,200 mg by mouth daily.     pantoprazole (PROTONIX) 20 MG tablet Take 1 tablet (20 mg total) by mouth daily.     Probiotic Product (PROBIOTIC PO) Take 1 tablet by mouth daily.     raloxifene (EVISTA) 60 MG tablet Take 1 tablet (60 mg total) by mouth daily. 90 tablet 3   SUMAtriptan (IMITREX) 50 MG tablet Take 1 tablet (50 mg total) by mouth every 2 (two) hours as needed for migraine. May repeat in 2 hours if headache persists or recurs. 10 tablet 0   topiramate  (TOPAMAX) 50 MG tablet Take 1 tablet (50 mg total) by mouth 2 (two) times daily.     VITAMIN D PO Take 125 mcg by mouth daily at 6 (six) AM.     No current facility-administered medications for this visit.    PHYSICAL EXAMINATION: ECOG PERFORMANCE STATUS: 1 - Symptomatic but completely ambulatory  Vitals:   05/09/22 1007  BP: (!) 126/95  Pulse: 67  Resp: 18  Temp: 97.7 F (36.5 C)  SpO2: 100%   Filed Weights   05/09/22 1007  Weight: 210 lb 12.8 oz (95.6 kg)    BREAST: No palpable masses or nodules in either right or left breasts. No palpable axillary supraclavicular or infraclavicular adenopathy no breast tenderness or nipple discharge. (exam performed in the presence of a chaperone)  LABORATORY DATA:  I have reviewed the data as listed    Latest Ref Rng & Units 03/06/2018   11:46 AM  CMP  Glucose 65 - 99 mg/dL 94    BUN 6 - 20 mg/dL 7    Creatinine 0.44 - 1.00 mg/dL 0.68    Sodium 135 - 145 mmol/L 138    Potassium 3.5 - 5.1 mmol/L 3.4    Chloride 101 - 111 mmol/L 107    CO2 22 - 32 mmol/L 23    Calcium 8.9 - 10.3 mg/dL 8.7  Lab Results  Component Value Date   WBC 9.2 03/06/2018   HGB 13.8 03/06/2018   HCT 40.4 03/06/2018   MCV 89.0 03/06/2018   PLT 276 03/06/2018    ASSESSMENT & PLAN:  Atypical lobular hyperplasia [facility]) of left breast Left lumpectomy 03/11/2018: Atypical lobular hyperplasia Risk Reduction: Raloxifene started June 2019 Toxicities: Denies any hot flashes.  She gets migraines and is able to decrease the number of migraines by taking preventive medication.  Breast cancer surveillance:  1.  Mammogram  05/03/2022: Possible asymmetry and mass right breast warrants investigation, density C 2.  Breast MRI 08/11/2021: Benign breast density category C 3.  Breast exam 05/09/2022: Benign     Patient lives in [place]. She has an appointment for additional imaging of the breast on 05/16/2022. We will arrange for a breast MRI to be done in November  2023 She is very anxious about the findings of the mammogram.  Return to clinic in 1 year for follow-up    Orders Placed This Encounter  Procedures   MR BREAST BILATERAL W [place] CAD    Standing Status:   Future    Standing Expiration Date:   05/10/2023    Order Specific Question:   If indicated for the ordered procedure, I authorize the administration of contrast media per Radiology protocol    Answer:   Yes    Order Specific Question:   What is the patient's sedation requirement?    Answer:   No Sedation    Order Specific Question:   Does the patient have a pacemaker or implanted devices?    Answer:   No    Order Specific Question:   Preferred imaging location?    Answer:   [facility] (table limit-550lbs)    Order Specific Question:   Release to patient    Answer:   Immediate   The patient has a good understanding of the overall plan. she agrees with it. she will call with any problems that may develop before the next visit here. Total time spent: 30 mins including face to face time and time spent for planning, charting and co-ordination of care   Harriette Ohara, MD 05/09/22   I Gardiner Coins am scribing for Dr. Lindi Adie  I have reviewed the above documentation for accuracy and completeness, and I agree with the above.

## 2022-05-02 ENCOUNTER — Ambulatory Visit
Admission: RE | Admit: 2022-05-02 | Discharge: 2022-05-02 | Disposition: A | Discharge status: Home / Self Care | Payer: BC Managed Care – PPO | Source: Ambulatory Visit | Attending: Hematology and Oncology | Admitting: Hematology and Oncology

## 2022-05-02 DIAGNOSIS — Z1231 Encounter for screening mammogram for malignant neoplasm of breast: Secondary | ICD-10-CM

## 2022-05-04 ENCOUNTER — Other Ambulatory Visit: Payer: Self-pay | Admitting: Hematology and Oncology

## 2022-05-04 DIAGNOSIS — R928 Other abnormal and inconclusive findings on diagnostic imaging of breast: Secondary | ICD-10-CM

## 2022-05-09 ENCOUNTER — Inpatient Hospital Stay: Payer: BC Managed Care – PPO | Attending: Hematology and Oncology | Admitting: Hematology and Oncology

## 2022-05-09 DIAGNOSIS — N6092 Unspecified benign mammary dysplasia of left breast: Secondary | ICD-10-CM | POA: Insufficient documentation

## 2022-05-09 MED ORDER — RALOXIFENE HCL 60 MG PO TABS: 60.0000 mg | ORAL_TABLET | Freq: Every day | ORAL | 3 refills | Status: AC

## 2022-05-09 NOTE — Assessment & Plan Note (Addendum)
Left lumpectomy 03/11/2018: Atypical lobular hyperplasia Risk Reduction: Raloxifene started June 2019 Toxicities:Denies any hot flashes.  She gets migraines and is able to decrease the number of migraines by taking preventive medication.  Breast cancer surveillance: 1.Mammogram  05/03/2022: Possible asymmetry and mass right breast warrants investigation, density C 2.Breast MRI 08/11/2021: Benign breast density category C 3.  Breast exam 05/09/2022: Benign    Patient lives in [place]. She has an appointment for additional imaging of the breast on 05/16/2022. We will arrange for a breast MRI to be done in November 2023  Return to clinic in 1 year for follow-up

## 2022-05-16 ENCOUNTER — Ambulatory Visit
Admission: RE | Admit: 2022-05-16 | Discharge: 2022-05-16 | Disposition: A | Discharge status: Home / Self Care | Payer: BC Managed Care – PPO | Source: Ambulatory Visit | Attending: Hematology and Oncology | Admitting: Hematology and Oncology

## 2022-05-16 DIAGNOSIS — N6001 Solitary cyst of right breast: Secondary | ICD-10-CM | POA: Diagnosis not present

## 2022-05-16 DIAGNOSIS — R928 Other abnormal and inconclusive findings on diagnostic imaging of breast: Secondary | ICD-10-CM

## 2022-05-16 DIAGNOSIS — R922 Inconclusive mammogram: Secondary | ICD-10-CM | POA: Diagnosis not present

## 2022-05-31 DIAGNOSIS — M7731 Calcaneal spur, right foot: Secondary | ICD-10-CM | POA: Diagnosis not present

## 2022-05-31 DIAGNOSIS — M79671 Pain in right foot: Secondary | ICD-10-CM | POA: Diagnosis not present

## 2022-05-31 DIAGNOSIS — Z6832 Body mass index (BMI) 32.0-32.9, adult: Secondary | ICD-10-CM | POA: Diagnosis not present

## 2022-07-24 ENCOUNTER — Ambulatory Visit: Payer: BC Managed Care – PPO | Admitting: Podiatry

## 2022-07-24 ENCOUNTER — Encounter: Payer: Self-pay | Admitting: Podiatry

## 2022-07-24 ENCOUNTER — Ambulatory Visit: Payer: BC Managed Care – PPO

## 2022-07-24 DIAGNOSIS — M722 Plantar fascial fibromatosis: Secondary | ICD-10-CM

## 2022-07-24 MED ORDER — MELOXICAM 15 MG PO TABS: 15.0000 mg | ORAL_TABLET | Freq: Every day | ORAL | 0 refills | Status: AC

## 2022-07-24 MED ORDER — DEXAMETHASONE SODIUM PHOSPHATE 120 MG/30ML IJ SOLN
4.0000 mg | Freq: Once | INTRAMUSCULAR | Status: AC
  Administered 2022-07-24: 4 mg via INTRA_ARTICULAR

## 2022-07-24 NOTE — Progress Notes (Signed)
  Subjective:  Patient ID: Karen Chung, female    DOB: 1966-09-08,   MRN: [id number]  Chief Complaint  Patient presents with   Foot Pain    NP right heel pain/spur    56 y.o. female presents for concern of right heel pain that has been going on for about a month and a half. Has had flares of plantar fasciitis in the past. Was seen by PCP and X-rays taken and given ibuprofen but has still remained.  . Denies any other pedal complaints. Denies n/v/f/c.   Past Medical History:  Diagnosis Date   Abnormal cells in breast    Atypical in Left   Allergy    seasonal allergies   Cancer (HCC)    skin CA-removed   Complication of anesthesia    GERD (gastroesophageal reflux disease)    on meds   Headache    PONV (postoperative nausea and vomiting)     Objective:  Physical Exam: Vascular: DP/PT pulses 2/4 bilateral. CFT <3 seconds. Normal hair growth on digits. No edema.  Skin. No lacerations or abrasions bilateral feet.  Musculoskeletal: MMT 5/5 bilateral lower extremities in DF, PF, Inversion and Eversion. Deceased ROM in DF of ankle joint. Tender to medial calcaneal tubercle on the right. No pain with achilles PT or calcaneal squeezze  Neurological: Sensation intact to light touch.   Assessment:   1. Tendonitis of ankle or foot      Plan:  Patient was evaluated and treated and all questions answered. Discussed plantar fasciitis with patient.  X-rays reviewed and discussed with patient. No acute fractures or dislocations noted. Mild spurring noted at inferior calcaneus.  Discussed treatment options including, ice, NSAIDS, supportive shoes, bracing, and stretching. Stretching exercises provided to be done on a daily basis.   Prescription for meloxicam provided and sent to pharmacy. Patient requesting injection today. Procedure note below.   Follow-up 6 weeks or sooner if any problems arise. In the meantime, encouraged to call the office with any questions, concerns, change in  symptoms.   Procedure:  Discussed etiology, pathology, conservative vs. surgical therapies. At this time a plantar fascial injection was recommended.  The patient agreed and a sterile skin prep was applied.  An injection consisting of  dexamethasone and marcaine mixture was infiltrated at the point of maximal tenderness on the right Heel.  Bandaid applied. The patient tolerated this well and was given instructions for aftercare.     Lorenda Peck, DPM

## 2022-07-24 NOTE — Patient Instructions (Signed)

## 2022-07-25 DIAGNOSIS — Z85828 Personal history of other malignant neoplasm of skin: Secondary | ICD-10-CM | POA: Diagnosis not present

## 2022-07-25 DIAGNOSIS — D485 Neoplasm of uncertain behavior of skin: Secondary | ICD-10-CM | POA: Diagnosis not present

## 2022-07-25 DIAGNOSIS — D2239 Melanocytic nevi of other parts of face: Secondary | ICD-10-CM | POA: Diagnosis not present

## 2022-07-25 DIAGNOSIS — D220 Melanocytic nevi of lip: Secondary | ICD-10-CM | POA: Diagnosis not present

## 2022-07-25 DIAGNOSIS — D2221 Melanocytic nevi of right ear and external auricular canal: Secondary | ICD-10-CM | POA: Diagnosis not present

## 2022-07-25 DIAGNOSIS — D225 Melanocytic nevi of trunk: Secondary | ICD-10-CM | POA: Diagnosis not present

## 2022-08-10 DIAGNOSIS — L988 Other specified disorders of the skin and subcutaneous tissue: Secondary | ICD-10-CM | POA: Diagnosis not present

## 2022-08-10 DIAGNOSIS — D485 Neoplasm of uncertain behavior of skin: Secondary | ICD-10-CM | POA: Diagnosis not present

## 2022-09-06 ENCOUNTER — Ambulatory Visit: Payer: BC Managed Care – PPO | Admitting: Podiatry

## 2022-09-12 ENCOUNTER — Ambulatory Visit (INDEPENDENT_AMBULATORY_CARE_PROVIDER_SITE_OTHER): Payer: BC Managed Care – PPO | Admitting: Podiatry

## 2022-09-12 DIAGNOSIS — Z91199 Patient's noncompliance with other medical treatment and regimen due to unspecified reason: Secondary | ICD-10-CM

## 2022-09-12 NOTE — Progress Notes (Signed)
Pt was no show for follow up apt, no charge

## 2022-09-25 ENCOUNTER — Ambulatory Visit: Payer: BC Managed Care – PPO | Admitting: Podiatry

## 2022-10-17 DIAGNOSIS — B3731 Acute candidiasis of vulva and vagina: Secondary | ICD-10-CM | POA: Diagnosis not present

## 2022-10-17 DIAGNOSIS — M7731 Calcaneal spur, right foot: Secondary | ICD-10-CM | POA: Diagnosis not present

## 2022-10-17 DIAGNOSIS — C50912 Malignant neoplasm of unspecified site of left female breast: Secondary | ICD-10-CM | POA: Diagnosis not present

## 2022-10-17 DIAGNOSIS — Z1331 Encounter for screening for depression: Secondary | ICD-10-CM | POA: Diagnosis not present

## 2022-10-24 ENCOUNTER — Ambulatory Visit
Admission: RE | Admit: 2022-10-24 | Discharge: 2022-10-24 | Disposition: A | Discharge status: Home / Self Care | Payer: BC Managed Care – PPO | Source: Ambulatory Visit | Attending: Hematology and Oncology | Admitting: Hematology and Oncology

## 2022-10-24 DIAGNOSIS — N6092 Unspecified benign mammary dysplasia of left breast: Secondary | ICD-10-CM

## 2022-10-24 DIAGNOSIS — Z1239 Encounter for other screening for malignant neoplasm of breast: Secondary | ICD-10-CM | POA: Diagnosis not present

## 2022-10-24 MED ORDER — GADOPICLENOL 0.5 MMOL/ML IV SOLN
10.0000 mL | Freq: Once | INTRAVENOUS | Status: AC | PRN
  Administered 2022-10-24: 10 mL via INTRAVENOUS

## 2022-10-25 ENCOUNTER — Telehealth: Payer: Self-pay

## 2022-10-25 NOTE — Telephone Encounter (Signed)
-----   Message from Gardenia Phlegm, NP sent at 10/24/2022  3:25 PM EST ----- MRI# is negative for malignancy, please notify patient ----- Message ----- From: Interface, Rad Results In Sent: 10/24/2022  12:04 PM EST To: Nicholas Lose, MD

## 2022-10-25 NOTE — Telephone Encounter (Signed)
Pt made aware of results per NP. Verbalized thanks and understanding.

## 2023-01-11 DIAGNOSIS — R519 Headache, unspecified: Secondary | ICD-10-CM | POA: Diagnosis not present

## 2023-03-26 ENCOUNTER — Telehealth: Payer: Self-pay | Admitting: Hematology and Oncology

## 2023-03-26 ENCOUNTER — Other Ambulatory Visit: Payer: Self-pay | Admitting: Hematology and Oncology

## 2023-03-26 DIAGNOSIS — Z1231 Encounter for screening mammogram for malignant neoplasm of breast: Secondary | ICD-10-CM

## 2023-03-26 NOTE — Telephone Encounter (Signed)
patient called to verify appointment time and date.

## 2023-05-04 ENCOUNTER — Ambulatory Visit
Admission: RE | Admit: 2023-05-04 | Discharge: 2023-05-04 | Disposition: A | Discharge status: Home / Self Care | Payer: 59 | Source: Ambulatory Visit | Attending: Hematology and Oncology | Admitting: Hematology and Oncology

## 2023-05-04 DIAGNOSIS — Z1231 Encounter for screening mammogram for malignant neoplasm of breast: Secondary | ICD-10-CM | POA: Diagnosis not present

## 2023-05-10 ENCOUNTER — Inpatient Hospital Stay: Payer: 59 | Attending: Hematology and Oncology | Admitting: Hematology and Oncology

## 2023-05-10 NOTE — Assessment & Plan Note (Deleted)
Left lumpectomy 03/11/2018: Atypical lobular hyperplasia Risk Reduction: Raloxifene started June 2019 Toxicities: Denies any hot flashes. Since she completed 5 years of raloxifene patient was recommended to discontinue it at this time.  She gets migraines and is able to decrease the number of migraines by taking preventive medication.   Breast cancer surveillance:  1.  Mammogram 05/08/2023: Benign, density C 2.  Breast MRI 10/24/2022: Benign breast density category C 3.  Breast exam 05/10/2023: Benign     Patient lives in [place]. She has an appointment for additional imaging of the breast on 05/16/2022. We will arrange for a breast MRI to be done in November 2023 She is very anxious about the findings of the mammogram.   Return to clinic in 1 year for follow-up

## 2023-05-18 DIAGNOSIS — Z01419 Encounter for gynecological examination (general) (routine) without abnormal findings: Secondary | ICD-10-CM | POA: Diagnosis not present

## 2023-05-18 DIAGNOSIS — Z1331 Encounter for screening for depression: Secondary | ICD-10-CM | POA: Diagnosis not present

## 2023-05-18 DIAGNOSIS — Z6833 Body mass index (BMI) 33.0-33.9, adult: Secondary | ICD-10-CM | POA: Diagnosis not present

## 2023-06-10 NOTE — Progress Notes (Signed)
Patient Care Team: Buckner Malta, MD as PCP - General (Family Medicine)  DIAGNOSIS: No diagnosis found.  SUMMARY OF ONCOLOGIC HISTORY: Oncology History   No history exists.    CHIEF COMPLIANT:   INTERVAL HISTORY: Karen Chung is a   ALLERGIES:  is allergic to codeine and erythromycin.  MEDICATIONS:  Current Outpatient Medications  Medication Sig Dispense Refill   amitriptyline (ELAVIL) 10 MG tablet      azelastine (ASTELIN) 0.1 % nasal spray Place into both nostrils as needed for rhinitis. Use in each nostril as directed     B Complex-C-Folic Acid (SM B SUPER VITAMIN COMPLEX PO) Take 1 tablet by mouth daily at 6 (six) AM.     buPROPion (WELLBUTRIN XL) 150 MG 24 hr tablet Take 1 tablet (150 mg total) by mouth daily.     cetirizine (ZYRTEC ALLERGY) 10 MG tablet Take 1 tablet (10 mg total) by mouth daily.     ibuprofen (ADVIL) 800 MG tablet Take 800 mg by mouth 3 (three) times daily.     ibuprofen (ADVIL,MOTRIN) 200 MG tablet Take 200 mg by mouth every 6 (six) hours as needed.     meloxicam (MOBIC) 15 MG tablet Take 1 tablet (15 mg total) by mouth daily. 30 tablet 0   metroNIDAZOLE (METROCREAM) 0.75 % cream SMARTSIG:sparingly Topical Twice Daily     moxifloxacin (VIGAMOX) 0.5 % ophthalmic solution      Multiple Vitamin (MULTIVITAMIN PO) Take 1 tablet by mouth daily at 6 (six) AM.     Omega-3 Fatty Acids (FISH OIL PO) Take 1,200 mg by mouth daily.     pantoprazole (PROTONIX) 20 MG tablet Take 1 tablet (20 mg total) by mouth daily.     pantoprazole (PROTONIX) 40 MG tablet Take 40 mg by mouth 2 (two) times daily.     Probiotic Product (PROBIOTIC PO) Take 1 tablet by mouth daily.     raloxifene (EVISTA) 60 MG tablet Take 1 tablet (60 mg total) by mouth daily. 90 tablet 3   SUMAtriptan (IMITREX) 50 MG tablet Take 1 tablet (50 mg total) by mouth every 2 (two) hours as needed for migraine. May repeat in 2 hours if headache persists or recurs. 10 tablet 0   topiramate (TOPAMAX)  50 MG tablet Take 1 tablet (50 mg total) by mouth 2 (two) times daily.     VITAMIN D PO Take 125 mcg by mouth daily at 6 (six) AM.     No current facility-administered medications for this visit.    PHYSICAL EXAMINATION: ECOG PERFORMANCE STATUS: {CHL ONC ECOG [phone]}  There were no vitals filed for this visit. There were no vitals filed for this visit.  BREAST:*** No palpable masses or nodules in either right or left breasts. No palpable axillary supraclavicular or infraclavicular adenopathy no breast tenderness or nipple discharge. (exam performed in the presence of a chaperone)  LABORATORY DATA:  I have reviewed the data as listed    Latest Ref Rng & Units 03/06/2018   11:46 AM  CMP  Glucose 65 - 99 mg/dL 94   BUN 6 - 20 mg/dL 7   Creatinine 1.61 - 0.96 mg/dL 0.45   Sodium 409 - 811 mmol/L 138   Potassium 3.5 - 5.1 mmol/L 3.4   Chloride 101 - 111 mmol/L 107   CO2 22 - 32 mmol/L 23   Calcium 8.9 - 10.3 mg/dL 8.7     Lab Results  Component Value Date   WBC 9.2 03/06/2018   HGB  13.8 03/06/2018   HCT 40.4 03/06/2018   MCV 89.0 03/06/2018   PLT 276 03/06/2018    ASSESSMENT & PLAN:  No problem-specific Assessment & Plan notes found for this encounter.    No orders of the defined types were placed in this encounter.  The patient has a good understanding of the overall plan. she agrees with it. she will call with any problems that may develop before the next visit here. Total time spent: 30 mins including face to face time and time spent for planning, charting and co-ordination of care   Sherlyn Lick, CMA 06/10/23    I Janan Ridge am acting as a Neurosurgeon for [employer]  ***

## 2023-06-12 ENCOUNTER — Other Ambulatory Visit: Payer: Self-pay

## 2023-06-12 ENCOUNTER — Inpatient Hospital Stay: Payer: 59 | Attending: Hematology and Oncology | Admitting: Hematology and Oncology

## 2023-06-12 VITALS — BP 148/79 | HR 80 | Temp 97.7°F | Resp 18 | Ht 67.0 in | Wt 218.6 lb

## 2023-06-12 DIAGNOSIS — N6092 Unspecified benign mammary dysplasia of left breast: Secondary | ICD-10-CM | POA: Diagnosis not present

## 2023-06-12 DIAGNOSIS — Z79899 Other long term (current) drug therapy: Secondary | ICD-10-CM | POA: Insufficient documentation

## 2023-06-12 NOTE — Assessment & Plan Note (Addendum)
Left lumpectomy 03/11/2018: Atypical lobular hyperplasia Risk Reduction: Raloxifene started June 2019-June 2024 Toxicities: Denies any hot flashes.   She gets migraines and is able to decrease the number of migraines by taking preventive medication.   Breast cancer surveillance:  1.  Mammogram 05/08/2023: Benign, density C 2.  Breast MRI 10/24/2022: Benign breast density category C (MRIs can be done every 2 years).  We ordered the next breast MRI to be done in 2025. 3.  Breast exam 06/12/2023: Benign   She will request her primary care physician to order her breast MRI subsequent to that. Patient lives in [place]. Return to clinic on an as-needed basis

## 2023-08-19 IMAGING — MR MR BREAST BILAT WO/W CM
8 of 12 series · 34 of 48 positions shown · IV contrast (10 ml gadavist)
Comparison: Previous exam(s).

CLINICAL DATA: The patient has greater than a 20% lifetime risk of
breast cancer. Prior left breast excision in 6098 for atypical
ductal hyperplasia. Previous MRI guided biopsy on the left
demonstrating fibrocystic change.

LABS:  None
EXAM:
BILATERAL BREAST MRI WITH AND WITHOUT CONTRAST
TECHNIQUE: Multiplanar, multisequence MR images of both breasts were obtained
prior to and following the intravenous administration of 10 ml of
Gadavist

[Series 2: t2_tirm_tra ipat (a-p) · axial · 1.2mm · 0.89mm/px · z∈[-88,+84]mm · 5 of 144 slices shown]
[im 1/144]
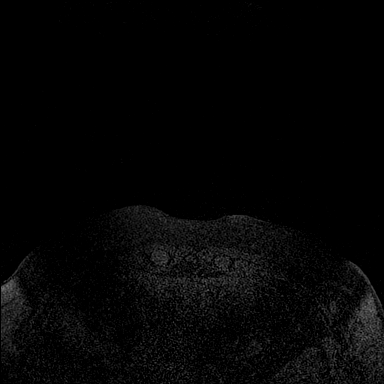
[im 36/144]
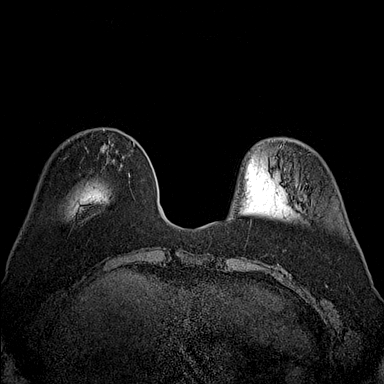
[im 72/144]
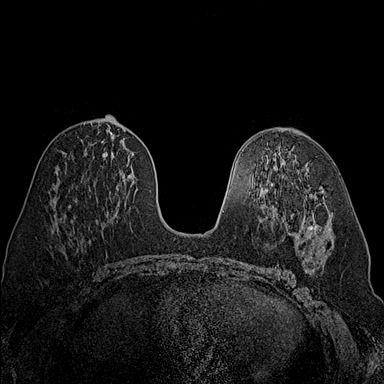
[im 108/144]
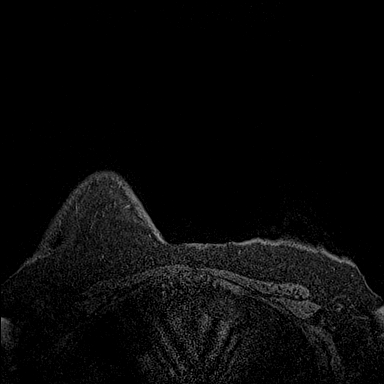
[im 144/144]
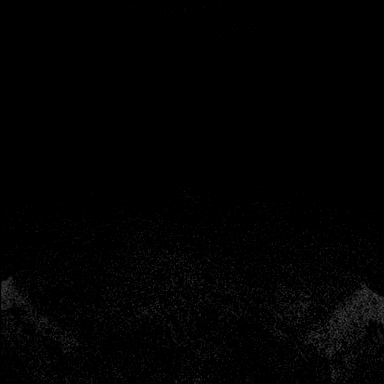

[Series 3: fl3d pre-cm no · axial · non-contrast · 1.2mm · 0.89mm/px · z∈[-88,+84]mm · 5 of 144 slices shown]
[im 1/144]
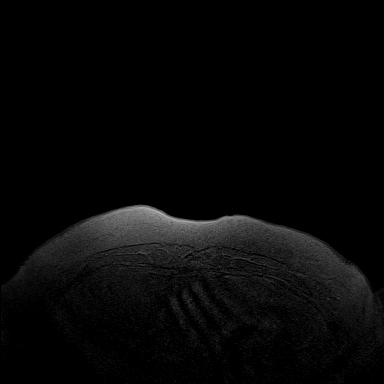
[im 36/144]
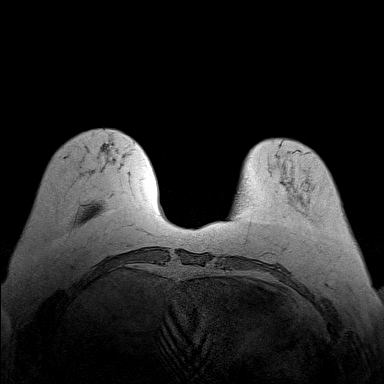
[im 72/144]
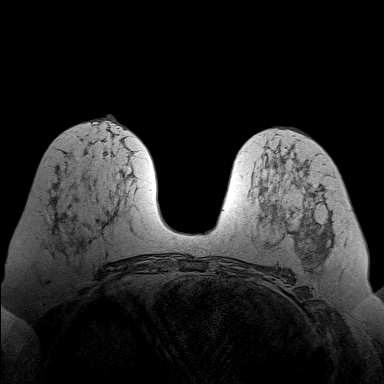
[im 108/144]
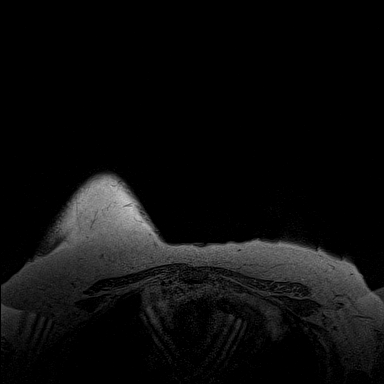
[im 144/144]
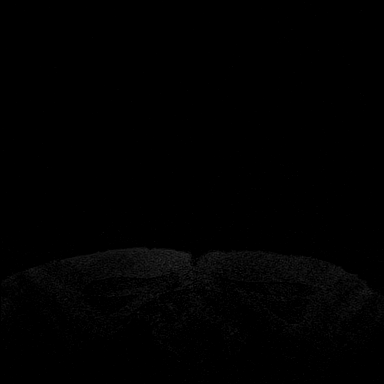

[Series 4: fl3d pre-cm · axial · non-contrast · 1.2mm · 0.89mm/px · z∈[-88,+84]mm · 5 of 144 slices shown]
[im 1/144]
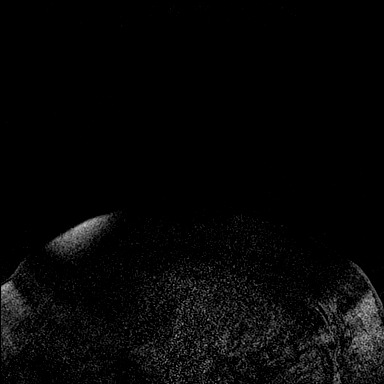
[im 36/144]
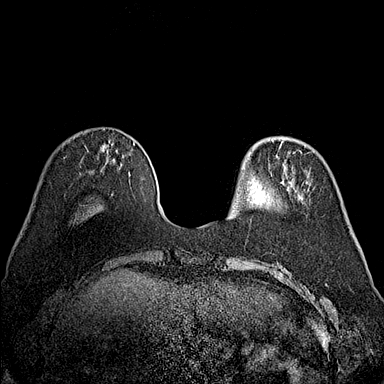
[im 72/144]
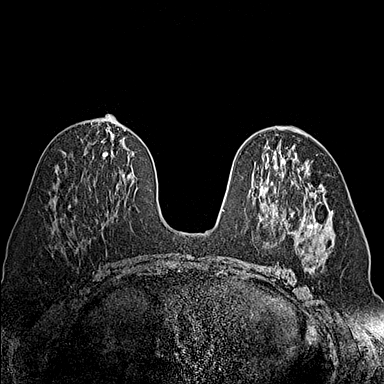
[im 108/144]
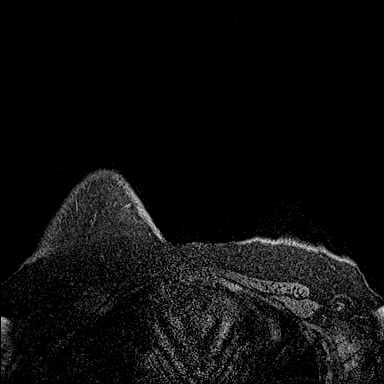
[im 144/144]
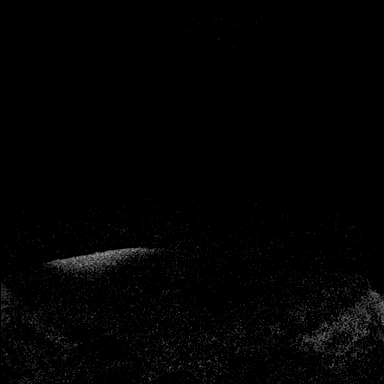

[Series 5: fl3d post-cm 20 · axial · 1.2mm · 0.89mm/px · z∈[-88,+84]mm · 5 of 144 slices shown (1 of 3)]
[im 1/144]
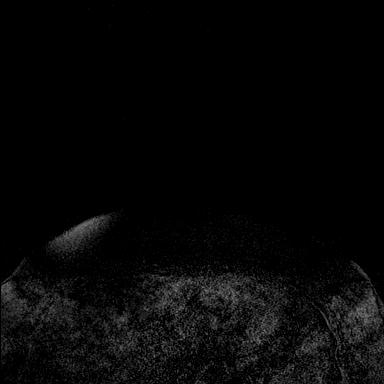
[im 36/144]
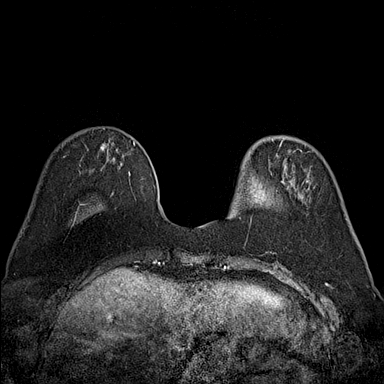
[im 72/144]
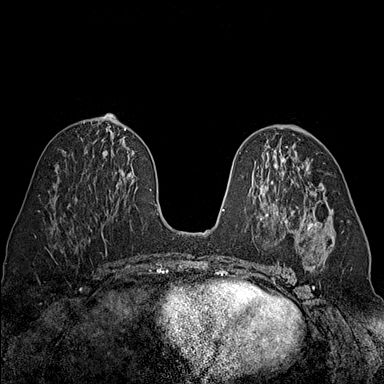
[im 108/144]
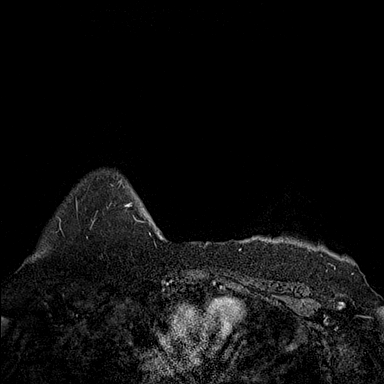
[im 144/144]
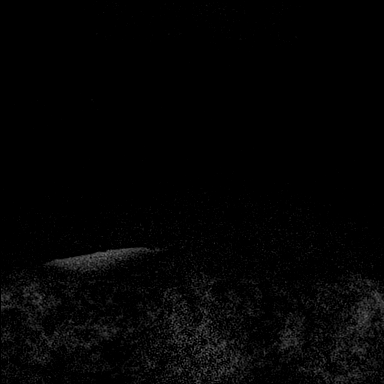

[Series 6: fl3d post-cm 20 · axial · 1.2mm · 0.89mm/px · z∈[-88,+84]mm · 5 of 144 slices shown (2 of 3)]
[im 1/144]
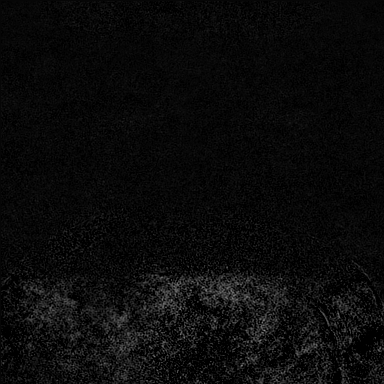
[im 36/144]
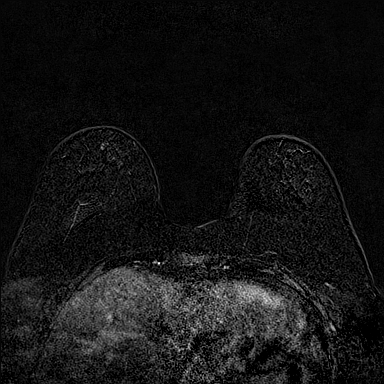
[im 72/144]
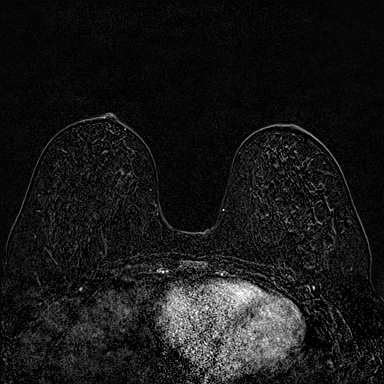
[im 108/144]
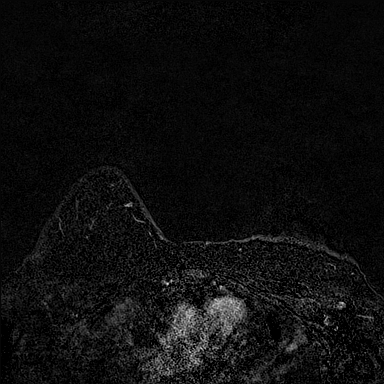
[im 144/144]
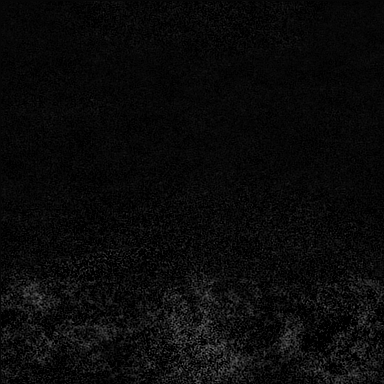

[Series 7: fl3d post-cm 20 · axial · 172.8mm · 0.89mm/px · 1 of 1 slices shown (3 of 3)]
[im 1/1]
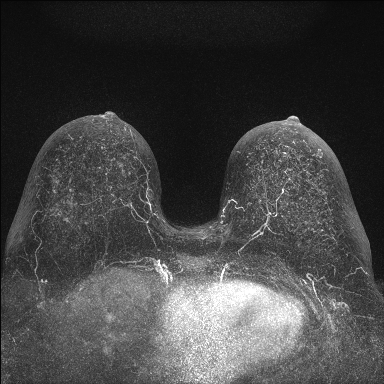

[Series 8: fl3d post-cm 3 · axial · 1.2mm · 0.89mm/px · z∈[-88,+84]mm · 5 of 144 slices shown (1 of 2)]
[im 1/144]
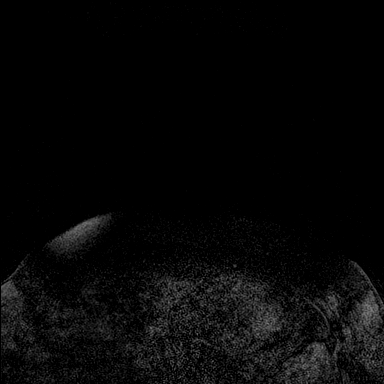
[im 36/144]
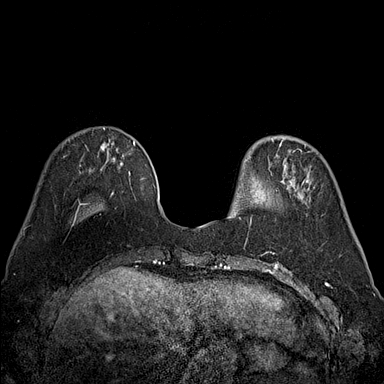
[im 72/144]
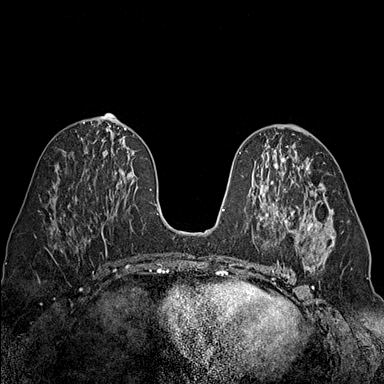
[im 108/144]
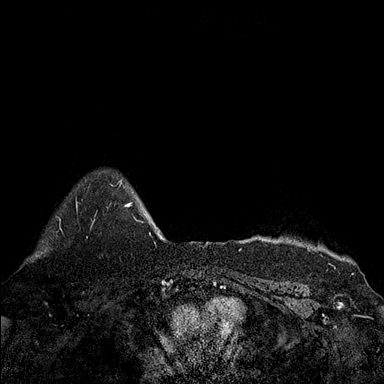
[im 144/144]
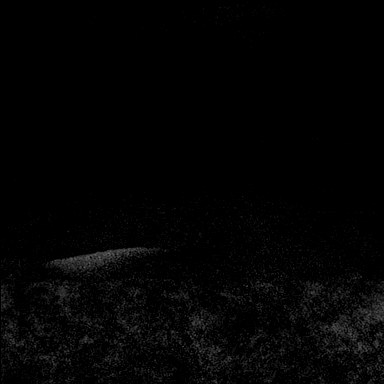

[Series 9: fl3d post-cm 3 · axial · 1.2mm · 0.89mm/px · z∈[-88,-3]mm · 3 of 144 slices shown (2 of 2)]
[im 1/144]
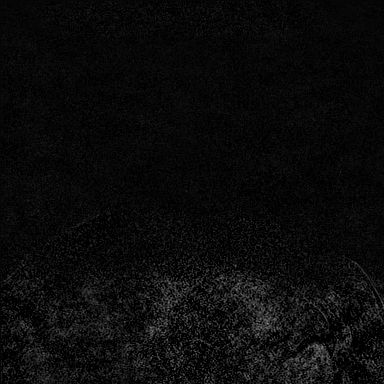
[im 36/144]
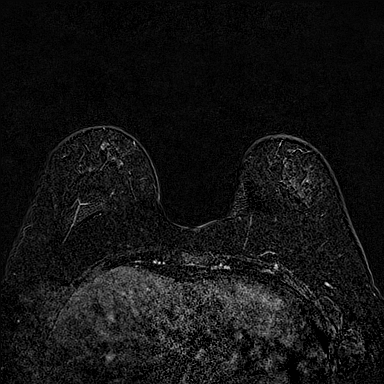
[im 72/144]
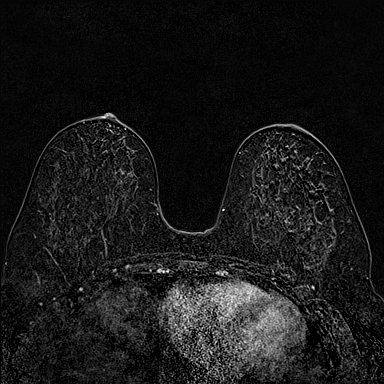

[34 of 48 positions shown; findings below may reference images not displayed]

Three-dimensional MR images were rendered by post-processing of the
original MR data on an independent workstation. The
three-dimensional MR images were interpreted, and findings are
reported in the following complete MRI report for this study. Three
dimensional images were evaluated at the independent interpreting
workstation using the DynaCAD thin client.
FINDINGS: Breast composition: c. Heterogeneous fibroglandular tissue.

Background parenchymal enhancement: Mild

Right breast: No mass or abnormal enhancement.

Left breast: An intramammary lymph node is seen in the lower outer
left breast, unchanged. Post biopsy and surgical changes are seen on
the left. No evidence of malignancy on the left.

Lymph nodes: No abnormal appearing lymph nodes.

Ancillary findings:  None.
IMPRESSION: No MRI evidence of breast cancer.

RECOMMENDATION:
Recommend continued annual screening mammography in Tuesday February, 2022.
Recommend annual breast MRI due to the patient's high risk status.

BI-RADS CATEGORY  2: Benign.

## 2023-10-24 ENCOUNTER — Encounter: Payer: Self-pay | Admitting: Hematology and Oncology

## 2023-10-25 ENCOUNTER — Encounter: Payer: Self-pay | Admitting: Hematology and Oncology

## 2023-10-26 ENCOUNTER — Encounter: Payer: Self-pay | Admitting: Hematology and Oncology

## 2023-10-30 ENCOUNTER — Other Ambulatory Visit: Payer: 59

## 2024-01-15 DIAGNOSIS — R509 Fever, unspecified: Secondary | ICD-10-CM | POA: Diagnosis not present

## 2024-01-15 DIAGNOSIS — Z6832 Body mass index (BMI) 32.0-32.9, adult: Secondary | ICD-10-CM | POA: Diagnosis not present

## 2024-07-23 DIAGNOSIS — Z01419 Encounter for gynecological examination (general) (routine) without abnormal findings: Secondary | ICD-10-CM | POA: Diagnosis not present

## 2024-07-29 DIAGNOSIS — Z85828 Personal history of other malignant neoplasm of skin: Secondary | ICD-10-CM | POA: Diagnosis not present

## 2024-07-29 DIAGNOSIS — L821 Other seborrheic keratosis: Secondary | ICD-10-CM | POA: Diagnosis not present

## 2024-07-29 DIAGNOSIS — D2271 Melanocytic nevi of right lower limb, including hip: Secondary | ICD-10-CM | POA: Diagnosis not present

## 2024-07-29 DIAGNOSIS — L718 Other rosacea: Secondary | ICD-10-CM | POA: Diagnosis not present

## 2024-07-29 DIAGNOSIS — D2262 Melanocytic nevi of left upper limb, including shoulder: Secondary | ICD-10-CM | POA: Diagnosis not present

## 2024-07-29 DIAGNOSIS — D2239 Melanocytic nevi of other parts of face: Secondary | ICD-10-CM | POA: Diagnosis not present

## 2024-07-29 DIAGNOSIS — D2261 Melanocytic nevi of right upper limb, including shoulder: Secondary | ICD-10-CM | POA: Diagnosis not present

## 2024-07-29 DIAGNOSIS — L918 Other hypertrophic disorders of the skin: Secondary | ICD-10-CM | POA: Diagnosis not present

## 2024-07-29 DIAGNOSIS — D225 Melanocytic nevi of trunk: Secondary | ICD-10-CM | POA: Diagnosis not present

## 2024-08-01 DIAGNOSIS — R768 Other specified abnormal immunological findings in serum: Secondary | ICD-10-CM | POA: Diagnosis not present

## 2024-08-07 ENCOUNTER — Other Ambulatory Visit: Payer: Self-pay | Admitting: Family Medicine

## 2024-08-07 DIAGNOSIS — Z1231 Encounter for screening mammogram for malignant neoplasm of breast: Secondary | ICD-10-CM

## 2024-08-20 ENCOUNTER — Ambulatory Visit
Admission: RE | Admit: 2024-08-20 | Discharge: 2024-08-20 | Disposition: A | Discharge status: Home / Self Care | Source: Ambulatory Visit | Attending: Family Medicine | Admitting: Family Medicine

## 2024-08-20 DIAGNOSIS — Z1231 Encounter for screening mammogram for malignant neoplasm of breast: Secondary | ICD-10-CM | POA: Diagnosis not present

## 2024-10-28 DIAGNOSIS — M7989 Other specified soft tissue disorders: Secondary | ICD-10-CM | POA: Diagnosis not present

## 2024-10-28 DIAGNOSIS — E669 Obesity, unspecified: Secondary | ICD-10-CM | POA: Diagnosis not present

## 2024-10-28 DIAGNOSIS — Z6832 Body mass index (BMI) 32.0-32.9, adult: Secondary | ICD-10-CM | POA: Diagnosis not present

## 2024-10-28 DIAGNOSIS — R7689 Other specified abnormal immunological findings in serum: Secondary | ICD-10-CM | POA: Diagnosis not present

## 2024-10-28 DIAGNOSIS — M2559 Pain in other specified joint: Secondary | ICD-10-CM | POA: Diagnosis not present

## 2024-10-28 DIAGNOSIS — D8989 Other specified disorders involving the immune mechanism, not elsewhere classified: Secondary | ICD-10-CM | POA: Diagnosis not present

## 2025-01-06 ENCOUNTER — Encounter: Payer: Self-pay | Admitting: Family Medicine
